# Patient Record
Sex: Female | Born: 1955 | Race: White | Hispanic: No | Marital: Married | Smoking: Never smoker
Health system: Southern US, Community
[De-identification: ages and names within clinical notes are randomized; demographics above are authoritative.]

## PROBLEM LIST (undated history)

## (undated) DIAGNOSIS — Z8742 Personal history of other diseases of the female genital tract: Secondary | ICD-10-CM

## (undated) DIAGNOSIS — R55 Syncope and collapse: Secondary | ICD-10-CM

## (undated) HISTORY — DX: Personal history of other diseases of the female genital tract: Z87.42

## (undated) HISTORY — PX: BREAST BIOPSY: SHX20

## (undated) HISTORY — DX: Syncope and collapse: R55

## (undated) HISTORY — PX: CATARACT EXTRACTION: SUR2

---

## 1997-11-14 ENCOUNTER — Other Ambulatory Visit: Admission: RE | Admit: 1997-11-14 | Discharge: 1997-11-14 | Payer: Self-pay | Admitting: Obstetrics and Gynecology

## 1999-06-24 ENCOUNTER — Other Ambulatory Visit: Admission: RE | Admit: 1999-06-24 | Discharge: 1999-06-24 | Payer: Self-pay | Admitting: Obstetrics and Gynecology

## 2002-03-09 ENCOUNTER — Encounter: Payer: Self-pay | Admitting: Obstetrics and Gynecology

## 2002-03-09 ENCOUNTER — Encounter: Admission: RE | Admit: 2002-03-09 | Discharge: 2002-03-09 | Payer: Self-pay | Admitting: Obstetrics and Gynecology

## 2004-02-14 ENCOUNTER — Ambulatory Visit: Payer: Self-pay | Admitting: Internal Medicine

## 2004-03-08 ENCOUNTER — Other Ambulatory Visit: Admission: RE | Admit: 2004-03-08 | Discharge: 2004-03-08 | Payer: Self-pay | Admitting: Internal Medicine

## 2004-03-08 ENCOUNTER — Ambulatory Visit: Payer: Self-pay | Admitting: Internal Medicine

## 2004-03-14 ENCOUNTER — Encounter: Admission: RE | Admit: 2004-03-14 | Discharge: 2004-03-14 | Payer: Self-pay | Admitting: Internal Medicine

## 2004-03-14 ENCOUNTER — Encounter (INDEPENDENT_AMBULATORY_CARE_PROVIDER_SITE_OTHER): Payer: Self-pay | Admitting: *Deleted

## 2004-11-07 ENCOUNTER — Encounter: Admission: RE | Admit: 2004-11-07 | Discharge: 2004-11-07 | Payer: Self-pay | Admitting: Internal Medicine

## 2005-05-23 ENCOUNTER — Encounter: Admission: RE | Admit: 2005-05-23 | Discharge: 2005-05-23 | Payer: Self-pay | Admitting: Internal Medicine

## 2005-07-01 ENCOUNTER — Encounter: Payer: Self-pay | Admitting: Internal Medicine

## 2005-07-01 ENCOUNTER — Other Ambulatory Visit: Admission: RE | Admit: 2005-07-01 | Discharge: 2005-07-01 | Payer: Self-pay | Admitting: Internal Medicine

## 2005-07-01 ENCOUNTER — Ambulatory Visit: Payer: Self-pay | Admitting: Internal Medicine

## 2006-04-13 ENCOUNTER — Ambulatory Visit: Payer: Self-pay | Admitting: Internal Medicine

## 2006-07-03 ENCOUNTER — Encounter: Admission: RE | Admit: 2006-07-03 | Discharge: 2006-07-03 | Payer: Self-pay | Admitting: Internal Medicine

## 2006-08-04 ENCOUNTER — Ambulatory Visit: Payer: Self-pay | Admitting: Internal Medicine

## 2006-08-10 ENCOUNTER — Encounter: Admission: RE | Admit: 2006-08-10 | Discharge: 2006-08-10 | Payer: Self-pay | Admitting: Internal Medicine

## 2007-03-31 ENCOUNTER — Ambulatory Visit: Payer: Self-pay | Admitting: Internal Medicine

## 2007-03-31 ENCOUNTER — Encounter: Admission: RE | Admit: 2007-03-31 | Discharge: 2007-03-31 | Payer: Self-pay | Admitting: Internal Medicine

## 2007-03-31 DIAGNOSIS — R109 Unspecified abdominal pain: Secondary | ICD-10-CM | POA: Insufficient documentation

## 2007-03-31 LAB — CONVERTED CEMR LAB
Bilirubin Urine: NEGATIVE
Blood in Urine, dipstick: NEGATIVE
Glucose, Urine, Semiquant: NEGATIVE
Specific Gravity, Urine: <1.005
Urobilinogen, UA: NEGATIVE
pH: 5

## 2007-04-01 ENCOUNTER — Telehealth: Payer: Self-pay | Admitting: Internal Medicine

## 2007-04-02 ENCOUNTER — Telehealth (INDEPENDENT_AMBULATORY_CARE_PROVIDER_SITE_OTHER): Payer: Self-pay | Admitting: *Deleted

## 2007-04-07 ENCOUNTER — Telehealth: Payer: Self-pay | Admitting: Internal Medicine

## 2007-04-29 ENCOUNTER — Ambulatory Visit (HOSPITAL_BASED_OUTPATIENT_CLINIC_OR_DEPARTMENT_OTHER): Admission: RE | Admit: 2007-04-29 | Discharge: 2007-04-29 | Payer: Self-pay | Admitting: General Surgery

## 2007-04-29 HISTORY — PX: HERNIA REPAIR: SHX51

## 2007-06-09 ENCOUNTER — Encounter: Payer: Self-pay | Admitting: Internal Medicine

## 2007-08-26 ENCOUNTER — Encounter: Admission: RE | Admit: 2007-08-26 | Discharge: 2007-08-26 | Payer: Self-pay | Admitting: Internal Medicine

## 2008-12-12 ENCOUNTER — Encounter: Admission: RE | Admit: 2008-12-12 | Discharge: 2008-12-12 | Payer: Self-pay | Admitting: Internal Medicine

## 2008-12-15 ENCOUNTER — Encounter: Admission: RE | Admit: 2008-12-15 | Discharge: 2008-12-15 | Payer: Self-pay | Admitting: Internal Medicine

## 2008-12-19 ENCOUNTER — Encounter: Payer: Self-pay | Admitting: Internal Medicine

## 2009-02-08 ENCOUNTER — Telehealth: Payer: Self-pay | Admitting: *Deleted

## 2009-02-08 ENCOUNTER — Ambulatory Visit: Payer: Self-pay | Admitting: Internal Medicine

## 2009-02-08 DIAGNOSIS — R300 Dysuria: Secondary | ICD-10-CM | POA: Insufficient documentation

## 2009-02-08 DIAGNOSIS — N3000 Acute cystitis without hematuria: Secondary | ICD-10-CM | POA: Insufficient documentation

## 2009-02-08 LAB — CONVERTED CEMR LAB: pH: 6

## 2010-04-01 ENCOUNTER — Other Ambulatory Visit: Payer: Self-pay | Admitting: Internal Medicine

## 2010-04-01 DIAGNOSIS — R921 Mammographic calcification found on diagnostic imaging of breast: Secondary | ICD-10-CM

## 2010-04-17 ENCOUNTER — Ambulatory Visit
Admission: RE | Admit: 2010-04-17 | Discharge: 2010-04-17 | Disposition: A | Discharge status: Home / Self Care | Payer: BC Managed Care – PPO | Source: Ambulatory Visit | Attending: Internal Medicine | Admitting: Internal Medicine

## 2010-04-17 DIAGNOSIS — R921 Mammographic calcification found on diagnostic imaging of breast: Secondary | ICD-10-CM

## 2010-07-16 NOTE — Op Note (Signed)
NAME:  Emma Day, AMSLER NO.:  [id number]   MEDICAL RECORD NO.:  [id number]          PATIENT TYPE:  AMB   LOCATION:  DSC                          FACILITY:  [facility]   PHYSICIAN:  Gabrielle Dare. Janee Morn, M.D.DATE OF BIRTH:  17-May-1955   DATE OF PROCEDURE:  04/29/2007  DATE OF DISCHARGE:                               OPERATIVE REPORT   PREOPERATIVE DIAGNOSIS:  Right inguinal hernia.   POSTOPERATIVE DIAGNOSIS:  Right inguinal hernia.   PROCEDURE:  Repair of right inguinal hernia with mesh.   SURGEON:  Gabrielle Dare. Janee Morn, M.D.   ANESTHESIA:  General.   HISTORY OF PRESENT ILLNESS:  Ms. Emma Day is a 55 year old white female who  I evaluated in the office for symptomatic right inguinal hernia.  She  presents today for elective repair.   PROCEDURE IN DETAIL:  She was identified in the preop holding area.  Informed consent was obtained.  Her site was marked.  She received  intravenous antibiotics.  She was brought to the operating room and  general anesthesia with laryngeal mask airway was administered by the  anesthesia staff.  Her abdomen and both groins were prepped and draped  in a sterile fashion.  0.25% Marcaine with epinephrine was injected  along the planned line of incision in the right inguinal region and out  towards the anterior superior iliac spine for postoperative pain relief.  A small right inguinal incision was made.  The subcutaneous tissues were  dissected down.  Bovie cautery was used to get excellent hemostasis.  Dissection continued down through Scarpa's fascia revealing external  oblique.  This was already attenuated due to the hernia.  The superior  leaflet was dissected free off the underlying aponeurosis and the  inferior leaflet was dissected down revealing the shelving edge of the  inguinal ligament.  Her hernia was present right along side the round  ligament.  It was a fluid filled hernia sac.  This was dissected back  and opened, drained, and  securely ligated with 2-0 Vicryl.  The round  ligament was then securely suture ligated proximally and distally and  then was divided.  An extra figure-of-eight ligation was placed  proximally to reinforce the closure of the hernia sac.  Excellent  hemostasis was obtained.  The hernia repair was then completed with a  polypropylene mesh that was cut in a tapered fashion with a rounded end.  This was secured medially to the tissues over the pubic tubercle with 0  Prolene, it was secured in a running fashion with 0 Prolene along the  shelving edge of the inguinal ligament inferiorly, superiorly it was  tacked down in an interrupted fashion to the tissues over the pubic  tubercle with 0 Prolene, and then it was tacked down to the  transversalis and secured in place.  It was tucked down under the  external oblique laterally and it was tacked down laterally to the  underlying musculature, as well, completing the repair.  Meticulous  hemostasis was ensured.  The area was copiously irrigated.  Additional  local anesthetic was injected, the mesh  laid nice and flat.  The  external oblique was then closed over the top of the mesh with a running  2-0 Vicryl suture, the subcutaneous tissues were irrigated, hemostasis  was ensured, Scarpa's fascia was approximated with interrupted 3-0  Vicryl sutures, and the skin was closed with a running 4-0 Monocryl  subcuticular  stitch.  Sponge, needle, and instrument counts were correct.  Benzoin,  Steri-Strips, and sterile dressings were applied.  The patient tolerated  the procedure well without apparent complications.  She was taken to the  recovery room in stable condition.      Gabrielle Dare Janee Morn, M.D.  Electronically Signed     BET/MEDQ  D:  04/29/2007  T:  04/29/2007  Job:  [id number]   cc:   Neta Mends. Fabian Sharp, MD  Juluis Mire, M.D.

## 2010-11-22 LAB — POCT HEMOGLOBIN-HEMACUE: Hemoglobin: 13.5

## 2010-12-06 ENCOUNTER — Encounter: Payer: Self-pay | Admitting: Internal Medicine

## 2010-12-11 ENCOUNTER — Encounter: Payer: Self-pay | Admitting: Internal Medicine

## 2010-12-12 ENCOUNTER — Ambulatory Visit (INDEPENDENT_AMBULATORY_CARE_PROVIDER_SITE_OTHER): Payer: BC Managed Care – PPO | Admitting: Internal Medicine

## 2010-12-12 ENCOUNTER — Encounter: Payer: Self-pay | Admitting: Internal Medicine

## 2010-12-12 ENCOUNTER — Other Ambulatory Visit (HOSPITAL_COMMUNITY)
Admission: RE | Admit: 2010-12-12 | Discharge: 2010-12-12 | Disposition: A | Discharge status: Home / Self Care | Payer: BC Managed Care – PPO | Source: Ambulatory Visit | Attending: Internal Medicine | Admitting: Internal Medicine

## 2010-12-12 DIAGNOSIS — R1031 Right lower quadrant pain: Secondary | ICD-10-CM | POA: Insufficient documentation

## 2010-12-12 DIAGNOSIS — Z23 Encounter for immunization: Secondary | ICD-10-CM

## 2010-12-12 DIAGNOSIS — Z124 Encounter for screening for malignant neoplasm of cervix: Secondary | ICD-10-CM

## 2010-12-12 DIAGNOSIS — Z01419 Encounter for gynecological examination (general) (routine) without abnormal findings: Secondary | ICD-10-CM | POA: Insufficient documentation

## 2010-12-12 NOTE — Patient Instructions (Signed)
Will arrange surgical consult. Call if worse in meantime. Keep  Wellness appt Will notify you when pap results are available.

## 2010-12-12 NOTE — Progress Notes (Signed)
  Subjective:    Patient ID: Emma Day, female    DOB: [birth year], 55 y.o.   MRN: [id number]  HPI Comes in for new problem .   Last ov was    12 /10 .    Has had hx of recurrent ovarian cyst  In the past  Now having  Dull pain on right side where had hernia surgery.   Dull  And sometimes goes down leg.  Worse with  Nothing.  Better with advil.  About 5/10  Comes and goes.  Still having periods spotting for 4-5 days and then 2 days of bleeding. Regularly.  No significant dysmenorrhea.  LMP about sept 15th.  Last pap a while ago.  Has been well since last visit  Due for check up .  appt in 2013  Review of Systems No fever  Some increase nocturia. Hematuria or other. No weight loss fever flank pain hematuria No colonoscopy yet.  Otherwise well.  Past history family history social history reviewed in the electronic medical record.     Objective:   Physical Exam WDWN in nad  Chest:  Clear to A&P without wheezes rales or rhonchi CV:  S1-S2 no gallops or murmurs peripheral perfusion is normal Mild kyphosis Abdomen:  Sof,t normal bowel sounds without hepatosplenomegaly, no guarding rebound  no CVA tenderness..   Well healed right ing hernia scar  Just superior has mass thickening  The length of the scar and this is midly tender . NO bulging or redness. Pelvic: NL ext GU, labia clear without lesions or rash . Vagina no lesions .Cervix: clear  UTERUS: Neg CMT Adnexa:  clear no masses . Some blood at os ( early period)  PAP done      Assessment & Plan:  Abd pelvic pain Seems assoc to area of hernia repair.  Pelvic basically normal although if  persistent or progressive consider pelvic transvag US.  Will begin with surgical opinion.

## 2010-12-19 ENCOUNTER — Encounter: Payer: Self-pay | Admitting: *Deleted

## 2010-12-19 NOTE — Progress Notes (Signed)
Quick Note:  Tell patient PAP is normal. ______ 

## 2011-01-01 ENCOUNTER — Encounter (INDEPENDENT_AMBULATORY_CARE_PROVIDER_SITE_OTHER): Payer: Self-pay | Admitting: General Surgery

## 2011-01-01 ENCOUNTER — Ambulatory Visit (INDEPENDENT_AMBULATORY_CARE_PROVIDER_SITE_OTHER): Payer: BC Managed Care – PPO | Admitting: General Surgery

## 2011-01-01 VITALS — BP 118/80 | HR 60 | Temp 97.7°F | Resp 18 | Ht 66.0 in | Wt 143.5 lb

## 2011-01-01 DIAGNOSIS — R1031 Right lower quadrant pain: Secondary | ICD-10-CM

## 2011-01-01 MED ORDER — PREGABALIN 75 MG PO CAPS: 75.0000 mg | ORAL_CAPSULE | Freq: Two times a day (BID) | ORAL | Status: DC

## 2011-01-01 NOTE — Progress Notes (Signed)
Subjective:     Patient ID: Emma Day, female   DOB: May 08, 1955, 55 y.o.   MRN: [id number]  HPI Patient status post right inguinal hernia repair with mesh and February of 2009. Over the past 2 months, she has developed pain extending from lateral to her incision down along her medial thigh and the right side. She's not had any swelling in the area. She has not had any overlying skin changes visible.  Review of Systems     Objective:   Physical Exam  Cardiovascular: Normal rate and regular rhythm.   Pulmonary/Chest: Effort normal and breath sounds normal. No respiratory distress. She has no wheezes.  Abdominal: Soft. She exhibits no distension and no mass. There is no tenderness. There is no rebound.  Inguinal exam reveals well-healed incision. Hernia repair is intact. No masses are felt. No skin changes are noted. The area of discomfort was indicated by the patient     Assessment:     Right inguinal pain status post right inguinal hernia repair with mesh is likely due to nerve irritation     Plan:     I recommend  a short course of Lyrica. I will see her back in 4 weeks. If this does not resolve she may need an injection.

## 2011-01-01 NOTE — Patient Instructions (Signed)
Do not drive until you see how the medication affects you

## 2011-01-29 ENCOUNTER — Encounter (INDEPENDENT_AMBULATORY_CARE_PROVIDER_SITE_OTHER): Payer: BC Managed Care – PPO | Admitting: General Surgery

## 2011-05-26 ENCOUNTER — Other Ambulatory Visit (INDEPENDENT_AMBULATORY_CARE_PROVIDER_SITE_OTHER): Payer: BC Managed Care – PPO

## 2011-05-26 DIAGNOSIS — Z0000 Encounter for general adult medical examination without abnormal findings: Secondary | ICD-10-CM

## 2011-05-26 LAB — BASIC METABOLIC PANEL
BUN: 14 mg/dL (ref 6–23)
CO2: 28 mEq/L (ref 19–32)
Chloride: 105 mEq/L (ref 96–112)
Creatinine, Ser: 0.9 mg/dL (ref 0.4–1.2)
Glucose, Bld: 98 mg/dL (ref 70–99)
Potassium: 5.1 mEq/L (ref 3.5–5.1)

## 2011-05-26 LAB — CBC WITH DIFFERENTIAL/PLATELET
Basophils Absolute: 0 10*3/uL (ref 0.0–0.1)
Eosinophils Absolute: 0.3 10*3/uL (ref 0.0–0.7)
Lymphocytes Relative: 30.7 % (ref 12.0–46.0)
Lymphs Abs: 1.9 10*3/uL (ref 0.7–4.0)
MCV: 91.2 fl (ref 78.0–100.0)
Platelets: 249 10*3/uL (ref 150.0–400.0)
RBC: 4.84 Mil/uL (ref 3.87–5.11)
RDW: 13.1 % (ref 11.5–14.6)

## 2011-05-26 LAB — POCT URINALYSIS DIPSTICK
Glucose, UA: NEGATIVE
Nitrite, UA: NEGATIVE
Urobilinogen, UA: 0.2

## 2011-05-26 LAB — HEPATIC FUNCTION PANEL
ALT: 17 U/L (ref 0–35)
AST: 17 U/L (ref 0–37)
Alkaline Phosphatase: 56 U/L (ref 39–117)
Bilirubin, Direct: 0 mg/dL (ref 0.0–0.3)
Total Bilirubin: 0.4 mg/dL (ref 0.3–1.2)
Total Protein: 7.2 g/dL (ref 6.0–8.3)

## 2011-05-26 LAB — LIPID PANEL
Total CHOL/HDL Ratio: 3
Triglycerides: 128 mg/dL (ref 0.0–149.0)

## 2011-06-03 ENCOUNTER — Encounter: Payer: Self-pay | Admitting: Internal Medicine

## 2011-06-03 ENCOUNTER — Ambulatory Visit (INDEPENDENT_AMBULATORY_CARE_PROVIDER_SITE_OTHER): Payer: BC Managed Care – PPO | Admitting: Internal Medicine

## 2011-06-03 VITALS — BP 102/76 | HR 80 | Temp 98.2°F | Ht 65.5 in | Wt 143.0 lb

## 2011-06-03 DIAGNOSIS — R1031 Right lower quadrant pain: Secondary | ICD-10-CM

## 2011-06-03 DIAGNOSIS — Z1211 Encounter for screening for malignant neoplasm of colon: Secondary | ICD-10-CM

## 2011-06-03 NOTE — Progress Notes (Signed)
Subjective:    Patient ID: Emma Day, female    DOB: February 15, 1956, 56 y.o.   MRN: [id number]  HPI  Patient comes in today for preventive visit and follow-up of medical issues. Update  history since  last visit: No major changes ; ,injury surgery or hospitalizations. Saw surgeon who said pain was abd wall scar and given lyrica but didn't take cause not that bad . Waxes and wanes  And tylenol as needed. Never had colonscopy. Otherwise feeling pretty healthy wears readers. Does reg exercise .    Review of Systems ROS:  GEN/ HEENT: No fever, significant weight changes sweats headaches vision problems hearing changes, CV/ PULM; No chest pain shortness of breath cough, syncope,edema  change in exercise tolerance. GI /GU: No adominal pain, vomiting, change in bowel habits. No blood in the stool. No significant GU symptoms. SKIN/HEME: ,no acute skin rashes suspicious lesions or bleeding. No lymphadenopathy, nodules, masses.  NEURO/ PSYCH:  No neurologic signs such as weakness numbness. No depression anxiety. IMM/ Allergy: No unusual infections.  Allergy .   REST of 12 system review negative except as per HPI .Marland Kitchen   No meds  Past history family history social history reviewed in the electronic medical record.       Objective:   Physical Exam BP 102/76  Pulse 80  Temp(Src) 98.2 F (36.8 C) (Oral)  Ht 5' 5.5" (1.664 m)  Wt 143 lb (64.864 kg)  BMI 23.43 kg/m2  SpO2 97%  LMP 05/20/2011 Physical Exam: Vital signs reviewed WUJ:WJXB is a well-developed well-nourished alert cooperative  white female who appears her stated age in no acute distress.  HEENT: normocephalic atraumatic , Eyes: PERRL EOM's full, conjunctiva clear, Nares: paten,t no deformity discharge or tenderness., Ears: no deformity EAC's clear TMs with normal landmarks. Mouth: clear OP, no lesions, edema.  Moist mucous membranes. Dentition in adequate repair. NECK: supple without masses, thyromegaly or bruits. CHEST/PULM:   Clear to auscultation and percussion breath sounds equal no wheeze , rales or rhonchi. No chest wall deformities or tenderness. Breast: normal by inspection . No dimpling, discharge, masses, tenderness or discharge . CV: PMI is nondisplaced, S1 S2 no gallops, murmurs, rubs. Peripheral pulses are full without delay.No JVD .  ABDOMEN: Bowel sounds normal nontender  No guard or rebound, no hepato splenomegal no CVA tenderness.  No hernia.  Small area near scar right lower groin area minimally tender no bulge.  Extremtities:  No clubbing cyanosis or edema, no acute joint swelling or redness no focal atrophy NEURO:  Oriented x3, cranial nerves 3-12 appear to be intact, no obvious focal weakness,gait within normal limits no abnormal reflexes or asymmetrical SKIN: No acute rashes normal turgor, color, no bruising or petechiae. PSYCH: Oriented, good eye contact, no obvious depression anxiety, cognition and judgment appear normal. LN: no cervical axillary inguinal adenopathy    Lab Results  Component Value Date   WBC 6.3 05/26/2011   HGB 14.5 05/26/2011   HCT 44.1 05/26/2011   PLT 249.0 05/26/2011   GLUCOSE 98 05/26/2011   CHOL 201* 05/26/2011   TRIG 128.0 05/26/2011   HDL 62.30 05/26/2011   LDLDIRECT 123.5 05/26/2011   ALT 17 05/26/2011   AST 17 05/26/2011   NA 143 05/26/2011   K 5.1 05/26/2011   CL 105 05/26/2011   CREATININE 0.9 05/26/2011   BUN 14 05/26/2011   CO2 28 05/26/2011   TSH 2.52 05/26/2011        Assessment & Plan:  Preventive Health Care  Counseled regarding healthy nutrition, exercise, sleep, injury prevention, calcium vit d and healthy weight .   Ref for colonoscopy.   Recheck in a year or as needed   rlq pain off and on   Poss scar tissue pain  Using pain meds prn tylenol. meds as needed.

## 2011-06-03 NOTE — Patient Instructions (Addendum)
Continue lifestyle intervention healthy eating and exercise .  Check up in about in a year  Flu shot yearly  Advised . Get colonoscopy.  Mammorgram every year . PAP every 3 years

## 2011-08-07 ENCOUNTER — Encounter: Payer: Self-pay | Admitting: Gastroenterology

## 2011-09-29 ENCOUNTER — Encounter: Payer: Self-pay | Admitting: Internal Medicine

## 2011-09-29 ENCOUNTER — Ambulatory Visit (INDEPENDENT_AMBULATORY_CARE_PROVIDER_SITE_OTHER): Payer: BC Managed Care – PPO | Admitting: Internal Medicine

## 2011-09-29 VITALS — BP 130/82 | HR 101 | Temp 99.0°F | Wt 141.0 lb

## 2011-09-29 DIAGNOSIS — N951 Menopausal and female climacteric states: Secondary | ICD-10-CM

## 2011-09-29 DIAGNOSIS — R0789 Other chest pain: Secondary | ICD-10-CM

## 2011-09-29 DIAGNOSIS — R071 Chest pain on breathing: Secondary | ICD-10-CM

## 2011-09-29 DIAGNOSIS — N920 Excessive and frequent menstruation with regular cycle: Secondary | ICD-10-CM

## 2011-09-29 DIAGNOSIS — J069 Acute upper respiratory infection, unspecified: Secondary | ICD-10-CM

## 2011-09-29 NOTE — Progress Notes (Signed)
  Subjective:    Patient ID: Emma Day, female    DOB: April 02, 1955, 56 y.o.   MRN: [id number]  HPI Patient comes in today for SDA for  new problem evaluation. Onset 5-6 days ago with a sore throat and over the last 4 days or so she developed a cough settled in her lungs. She is coughing hard with an occasional phlegm and one speck of blood this morning. She doesn't have hemoptysis. Denies shortness of breath or wheezing. Think she may have pulled something in her left anterior chest hurts to take a deep breath but no shortness of breath. No history of asthma lung disease tobacco. She tried Tylenol Cold and cough.  In addition as a question about menopause she had been having periods spaced out every 2-3 months and over the last month or 2 she has had a period every 2 weeks the last about 4 days. No spotting in between with significant pain asks about normalcy of this. Review of Systems Negative shortness of breath leg swelling pleurisy no fever and chills Past history family history social history reviewed in the electronic medical record. Outpatient Encounter Prescriptions as of 09/29/2011  Medication Sig Dispense Refill  . folic acid (FOLVITE) 1 MG tablet Take 1 mg by mouth daily.        . MULTIPLE VITAMIN PO Take by mouth.            Objective:   Physical Exam  BP 130/82  Pulse 101  Temp 99 F (37.2 C) (Oral)  Wt 141 lb (63.957 kg)  SpO2 98%  LMP 09/26/2011  HEENT: Normocephalic ;atraumatic , Eyes;  PERRL, EOMs  Full, lids and conjunctiva clear,,Ears: no deformities, canals nl, TM landmarks normal, Nose: no deformity or discharge  Mouth : OP clear without lesion or edema . Chest:  Clear to A&P without wheezes rales or rhonchi tender left anterior cc junction area  CV:  S1-S2 no gallops or murmurs peripheral perfusion is normal Abdomen:  Sof,t normal bowel sounds without hepatosplenomegaly, no guarding rebound or masses no CVAtenderness    Assessment & Plan:  Cough most likely  viral respiratory infection Left anterior chest discomfort most likely chest wall pain Expectant management symptomatic treatment declined codeine cough medicine at this time. Reviewed alarm symptoms and strategies. Should be a lot better next week. Contact us for alarm features Frequent periods perimenopausal these periods appear to be cyclic and lasting only 4-5 days although are more frequent than she has had. At this time it will is safe to wait and if he is continuing over the next couple months we will get gynecology to consult. If she has daily spotting and bleeding that would be another reason for referral.

## 2011-09-29 NOTE — Patient Instructions (Signed)
Comfort mesures and fu if  persistent or progressive or feer or alarm features   As discussed. Also if  Prolonged periods or spotting over the next few months   consider gyne consult.

## 2011-10-01 ENCOUNTER — Other Ambulatory Visit: Payer: Self-pay | Admitting: Internal Medicine

## 2011-10-01 DIAGNOSIS — Z1231 Encounter for screening mammogram for malignant neoplasm of breast: Secondary | ICD-10-CM

## 2011-10-13 ENCOUNTER — Ambulatory Visit
Admission: RE | Admit: 2011-10-13 | Discharge: 2011-10-13 | Disposition: A | Discharge status: Home / Self Care | Payer: BC Managed Care – PPO | Source: Ambulatory Visit | Attending: Internal Medicine | Admitting: Internal Medicine

## 2011-10-13 DIAGNOSIS — Z1231 Encounter for screening mammogram for malignant neoplasm of breast: Secondary | ICD-10-CM

## 2012-01-27 ENCOUNTER — Observation Stay (HOSPITAL_COMMUNITY)
Admission: EM | Admit: 2012-01-27 | Discharge: 2012-01-27 | Disposition: A | Discharge status: Home / Self Care | Payer: BC Managed Care – PPO | Attending: Internal Medicine | Admitting: Internal Medicine

## 2012-01-27 ENCOUNTER — Encounter (HOSPITAL_COMMUNITY): Payer: Self-pay | Admitting: Emergency Medicine

## 2012-01-27 ENCOUNTER — Emergency Department (HOSPITAL_COMMUNITY): Payer: BC Managed Care – PPO

## 2012-01-27 DIAGNOSIS — R0989 Other specified symptoms and signs involving the circulatory and respiratory systems: Secondary | ICD-10-CM

## 2012-01-27 DIAGNOSIS — Z23 Encounter for immunization: Secondary | ICD-10-CM | POA: Insufficient documentation

## 2012-01-27 DIAGNOSIS — R32 Unspecified urinary incontinence: Secondary | ICD-10-CM | POA: Insufficient documentation

## 2012-01-27 DIAGNOSIS — R4182 Altered mental status, unspecified: Secondary | ICD-10-CM | POA: Insufficient documentation

## 2012-01-27 DIAGNOSIS — H539 Unspecified visual disturbance: Secondary | ICD-10-CM | POA: Insufficient documentation

## 2012-01-27 DIAGNOSIS — R55 Syncope and collapse: Principal | ICD-10-CM | POA: Insufficient documentation

## 2012-01-27 LAB — BASIC METABOLIC PANEL
Calcium: 8.6 mg/dL (ref 8.4–10.5)
Chloride: 103 mEq/L (ref 96–112)
Creatinine, Ser: 0.78 mg/dL (ref 0.50–1.10)
Creatinine, Ser: 0.74 mg/dL (ref 0.50–1.10)
GFR calc Af Amer: >90 mL/min (ref >90)
GFR calc Af Amer: >90 mL/min (ref >90)
GFR calc non Af Amer: >90 mL/min (ref >90)
GFR calc non Af Amer: >90 mL/min (ref >90)
Sodium: 139 mEq/L (ref 135–145)

## 2012-01-27 LAB — CBC WITH DIFFERENTIAL/PLATELET
Basophils Absolute: 0 10*3/uL (ref 0.0–0.1)
Basophils Relative: 0 % (ref 0–1)
HCT: 38.7 % (ref 36.0–46.0)
Lymphocytes Relative: 14 % (ref 12–46)
MCHC: 33.6 g/dL (ref 30.0–36.0)
Monocytes Absolute: 0.4 10*3/uL (ref 0.1–1.0)
Neutro Abs: 6.6 10*3/uL (ref 1.7–7.7)
Neutrophils Relative %: 80 % — ABNORMAL HIGH (ref 43–77)
Platelets: 258 10*3/uL (ref 150–400)
RDW: 12.6 % (ref 11.5–15.5)
WBC: 8.3 10*3/uL (ref 4.0–10.5)

## 2012-01-27 LAB — URINALYSIS, ROUTINE W REFLEX MICROSCOPIC
Leukocytes, UA: NEGATIVE
Nitrite: NEGATIVE
Specific Gravity, Urine: 1.007 (ref 1.005–1.030)
Urobilinogen, UA: 0.2 mg/dL (ref 0.0–1.0)
pH: 5 (ref 5.0–8.0)

## 2012-01-27 LAB — CBC
Platelets: 254 10*3/uL (ref 150–400)
RBC: 4.16 MIL/uL (ref 3.87–5.11)
RDW: 12.8 % (ref 11.5–15.5)
WBC: 8.4 10*3/uL (ref 4.0–10.5)

## 2012-01-27 LAB — D-DIMER, QUANTITATIVE: D-Dimer, Quant: <0.27 ug/mL-FEU (ref 0.00–0.48)

## 2012-01-27 LAB — TROPONIN I: Troponin I: <0.3 ng/mL (ref <0.30)

## 2012-01-27 LAB — MAGNESIUM: Magnesium: 1.9 mg/dL (ref 1.5–2.5)

## 2012-01-27 MED ORDER — INFLUENZA VIRUS VACC SPLIT PF IM SUSP
0.5000 mL | Freq: Once | INTRAMUSCULAR | Status: AC
  Administered 2012-01-27: 0.5 mL via INTRAMUSCULAR
  Filled 2012-01-27: qty 0.5

## 2012-01-27 MED ORDER — SODIUM CHLORIDE 0.9 % IV SOLN: INTRAVENOUS | Status: AC

## 2012-01-27 MED ORDER — SODIUM CHLORIDE 0.9 % IV SOLN
INTRAVENOUS | Status: DC
  Administered 2012-01-27: 01:00:00 via INTRAVENOUS

## 2012-01-27 MED ORDER — ACETAMINOPHEN 650 MG RE SUPP: 650.0000 mg | Freq: Four times a day (QID) | RECTAL | Status: DC | PRN

## 2012-01-27 MED ORDER — SODIUM CHLORIDE 0.9 % IJ SOLN
3.0000 mL | Freq: Two times a day (BID) | INTRAMUSCULAR | Status: DC
  Administered 2012-01-27: 3 mL via INTRAVENOUS

## 2012-01-27 MED ORDER — ACETAMINOPHEN 325 MG PO TABS: 650.0000 mg | ORAL_TABLET | Freq: Four times a day (QID) | ORAL | Status: DC | PRN

## 2012-01-27 MED ORDER — SODIUM CHLORIDE 0.9 % IV SOLN: INTRAVENOUS | Status: DC

## 2012-01-27 MED ORDER — ONDANSETRON HCL 4 MG/2ML IJ SOLN: 4.0000 mg | Freq: Four times a day (QID) | INTRAMUSCULAR | Status: DC | PRN

## 2012-01-27 MED ORDER — ONDANSETRON HCL 4 MG PO TABS: 4.0000 mg | ORAL_TABLET | Freq: Four times a day (QID) | ORAL | Status: DC | PRN

## 2012-01-27 MED ORDER — FOLIC ACID 1 MG PO TABS
1.0000 mg | ORAL_TABLET | Freq: Every day | ORAL | Status: DC
  Administered 2012-01-27: 1 mg via ORAL
  Filled 2012-01-27: qty 1

## 2012-01-27 NOTE — Progress Notes (Signed)
UR Completed Ki Luckman Graves-Bigelow, RN,BSN [phone]  

## 2012-01-27 NOTE — H&P (Signed)
Emma Day is an 56 y.o. female.   Patient was seen and examined on January 27, 2012. PCP - Dr. Darrell Jewel. Chief Complaint: Loss of consciousness. HPI: 56 year old female with no significant past medical history was brought to the ER the patient had a brief spell of loss of consciousness. Last night around 11:20 PM when patient was sending off her guestsat her house she suddenly felt dizzy and difficulty to see. At this point patient was made to lie down. Later on patient lost consciousness for around 1-2 minutes as witnessed by patient's husband. During which patient's pulse measured by family was very low. Patient also had incontinence of urine. Denies any tongue bite or any seizure like activity. Patient regained consciousness and was  Confused for 2-3 minutes.  Patient has not had similar symptoms previously. Denies any headache palpitations chest pain shortness of breath or any focal deficit prior after the incident. In the ER patient had EKG which are signed normal sinus rhythm and CT head was negative for any acute. Patient has been admitted for further observation.  Past Medical History  Diagnosis Date  . History of ovarian cyst     Past Surgical History  Procedure Date  . Hernia repair 04/29/2007    inguinal rt     Family History  Problem Relation Age of Onset  . Coronary artery disease    . Parkinsonism    . Other      low hdl   Social History:  reports that she has never smoked. She does not have any smokeless tobacco history on file. She reports that she drinks about 3.5 ounces of alcohol per week. She reports that she does not use illicit drugs.  Allergies: No Known Allergies   (Not in a hospital admission)  Results for orders placed during the hospital encounter of 01/27/12 (from the past 48 hour(s))  BASIC METABOLIC PANEL     Status: Abnormal   Collection Time   01/27/12  1:31 AM      Component Value Range Comment   Sodium 136  135 - 145 mEq/L    Potassium 3.9   3.5 - 5.1 mEq/L    Chloride 103  96 - 112 mEq/L    CO2 21  19 - 32 mEq/L    Glucose, Bld 101 (*) 70 - 99 mg/dL    BUN 13  6 - 23 mg/dL    Creatinine, Ser 9.60  0.50 - 1.10 mg/dL    Calcium 9.0  8.4 - 45.4 mg/dL    GFR calc non Af Amer >90  >90 mL/min    GFR calc Af Amer >90  >90 mL/min   CBC WITH DIFFERENTIAL     Status: Abnormal   Collection Time   01/27/12  1:31 AM      Component Value Range Comment   WBC 8.3  4.0 - 10.5 K/uL    RBC 4.40  3.87 - 5.11 MIL/uL    Hemoglobin 13.0  12.0 - 15.0 g/dL    HCT 09.8  11.9 - 14.7 %    MCV 88.0  78.0 - 100.0 fL    MCH 29.5  26.0 - 34.0 pg    MCHC 33.6  30.0 - 36.0 g/dL    RDW 82.9  56.2 - 13.0 %    Platelets 258  150 - 400 K/uL    Neutrophils Relative 80 (*) 43 - 77 %    Neutro Abs 6.6  1.7 - 7.7 K/uL    Lymphocytes  Relative 14  12 - 46 %    Lymphs Abs 1.2  0.7 - 4.0 K/uL    Monocytes Relative 5  3 - 12 %    Monocytes Absolute 0.4  0.1 - 1.0 K/uL    Eosinophils Relative 1  0 - 5 %    Eosinophils Absolute 0.1  0.0 - 0.7 K/uL    Basophils Relative 0  0 - 1 %    Basophils Absolute 0.0  0.0 - 0.1 K/uL   MAGNESIUM     Status: Normal   Collection Time   01/27/12  1:31 AM      Component Value Range Comment   Magnesium 1.9  1.5 - 2.5 mg/dL   D-DIMER, QUANTITATIVE     Status: Normal   Collection Time   01/27/12  1:32 AM      Component Value Range Comment   D-Dimer, Quant <0.27  0.00 - 0.48 ug/mL-FEU   TROPONIN I     Status: Normal   Collection Time   01/27/12  1:34 AM      Component Value Range Comment   Troponin I <0.30  <0.30 ng/mL   URINALYSIS, ROUTINE W REFLEX MICROSCOPIC     Status: Normal   Collection Time   01/27/12  2:30 AM      Component Value Range Comment   Color, Urine YELLOW  YELLOW    APPearance CLEAR  CLEAR    Specific Gravity, Urine 1.007  1.005 - 1.030    pH 5.0  5.0 - 8.0    Glucose, UA NEGATIVE  NEGATIVE mg/dL    Hgb urine dipstick NEGATIVE  NEGATIVE    Bilirubin Urine NEGATIVE  NEGATIVE    Ketones, ur  NEGATIVE  NEGATIVE mg/dL    Protein, ur NEGATIVE  NEGATIVE mg/dL    Urobilinogen, UA 0.2  0.0 - 1.0 mg/dL    Nitrite NEGATIVE  NEGATIVE    Leukocytes, UA NEGATIVE  NEGATIVE MICROSCOPIC NOT DONE ON URINES WITH NEGATIVE PROTEIN, BLOOD, LEUKOCYTES, NITRITE, OR GLUCOSE <1000 mg/dL.   Ct Head Wo Contrast  01/27/2012  *RADIOLOGY REPORT*  Clinical Data: Syncope; urinary incontinence.  Lightheaded.  CT HEAD WITHOUT CONTRAST  Technique:  Contiguous axial images were obtained from the base of the skull through the vertex without contrast.  Comparison: None.  Findings: There is no evidence of acute infarction, mass lesion, or intra- or extra-axial hemorrhage on CT.  The posterior fossa, including the cerebellum, brainstem and fourth ventricle, is within normal limits.  The third and lateral ventricles, and basal ganglia are unremarkable in appearance.  The cerebral hemispheres are symmetric in appearance, with normal gray- white differentiation.  No mass effect or midline shift is seen.  There is no evidence of fracture; visualized osseous structures are unremarkable in appearance.  The visualized portions of the orbits are within normal limits.  The paranasal sinuses and mastoid air cells are well-aerated.  No significant soft tissue abnormalities are seen.  IMPRESSION: Unremarkable noncontrast CT of the head.   Original Report Authenticated By: Tonia Ghent, M.D.     Review of Systems  Constitutional: Negative.   HENT: Negative.   Eyes: Negative.   Respiratory: Negative.   Cardiovascular: Negative.   Gastrointestinal: Negative.   Genitourinary: Negative.   Musculoskeletal: Negative.   Skin: Negative.   Neurological: Positive for loss of consciousness.  Endo/Heme/Allergies: Negative.   Psychiatric/Behavioral: Negative.     Blood pressure 119/71, pulse 99, temperature 98.2 F (36.8 C), temperature source Oral, resp. rate 14,  last menstrual period 01/20/2012, SpO2 99.00%. Physical Exam    Constitutional: She is oriented to person, place, and time. She appears well-developed and well-nourished. No distress.  HENT:  Head: Normocephalic and atraumatic.  Right Ear: External ear normal.  Left Ear: External ear normal.  Nose: Nose normal.  Mouth/Throat: Oropharynx is clear and moist. No oropharyngeal exudate.  Eyes: Conjunctivae normal are normal. Pupils are equal, round, and reactive to light. Right eye exhibits no discharge. Left eye exhibits no discharge. No scleral icterus.  Neck: Normal range of motion.       ?Bruit on the right side.  Cardiovascular: Normal rate and regular rhythm.   Respiratory: Effort normal and breath sounds normal. No respiratory distress. She has no wheezes. She has no rales.  GI: Soft. Bowel sounds are normal. She exhibits no distension. There is no tenderness. There is no rebound.  Musculoskeletal: She exhibits no edema and no tenderness.  Neurological: She is alert and oriented to person, place, and time.       Moves all extremities.  Skin: Skin is warm and dry. She is not diaphoretic.     Assessment/Plan #1. Syncope - cause is not clear. Continue to monitor in telemetry to check for any arrhythmias. Since there was a concern for possible right carotid bruit, carotid Dopplers has been ordered. Check 2-D echo. Check orthostatics in a.m. For now gently hydrate.  CODE STATUS - full code.  Brandye Inthavong N. 01/27/2012, 3:33 AM

## 2012-01-27 NOTE — ED Notes (Signed)
Pt ambulated to bathroom independently with no problem; pt denies dizziness; just states feels weak

## 2012-01-27 NOTE — Discharge Summary (Signed)
Physician Discharge Summary  Emma Day:[id number] DOB: Jul 27, 1955 DOA: 01/27/2012  PCP: Lorretta Harp, MD  Admit date: 01/27/2012 Discharge date: 01/27/2012  Recommendations for Outpatient Follow-up:  1. PCP in 1 week  Discharge Diagnoses:  Principal Problem:  *Syncope   Discharge Condition: stable, improved  Diet recommendation: healthy heart  Wt Readings from Last 3 Encounters:  01/27/12 62.324 kg (137 lb 6.4 oz)  09/29/11 63.957 kg (141 lb)  06/03/11 64.864 kg (143 lb)    History of present illness:   56 year old female with no significant past medical history was brought to the ER the patient had a brief spell of loss of consciousness. Last night around 11:20 PM when patient was sending off her guests at her house she suddenly felt dizzy and difficulty to see.  At this point patient was made to lie down. Later on patient lost consciousness for around 1-2 minutes as witnessed by patient's husband. During which patient's pulse measured by family was very low. Patient also had incontinence of urine. Denies any tongue bite or any seizure like activity. Patient regained consciousness and was confused for 2-3 minutes. Patient has not had similar symptoms previously. Denies any headache palpitations chest pain shortness of breath or any focal deficit prior after the incident. In the ER patient had EKG which are signed normal sinus rhythm and CT head was negative for any acute. Patient has been admitted for further observation.   Hospital Course:   #1. Syncope - Troponin and d-dimer were negative.  ECG was normal sinus and telemetry showed no arrhythmias.  Her ECHO demonstrated normal structure and function.  Her carotid ultrasound demonstrated no significant carotid disease and antegrade vertebral flow.    Procedures:  ECHO  Carotid duplex  Consultations:  none  Discharge Exam: Filed Vitals:   01/27/12 0521  BP: 109/71  Pulse:   Temp:   Resp:    Filed  Vitals:   01/27/12 0515 01/27/12 0517 01/27/12 0519 01/27/12 0521  BP: 110/68 118/76 128/70 109/71  Pulse:      Temp: 98 F (36.7 C)     TempSrc:      Resp: 16     Height: 5\' 6"  (1.676 m)     Weight: 62.324 kg (137 lb 6.4 oz)     SpO2: 100%       General: Caucasian female, no acute distress HEENT:  MMM Cardiovascular: RRR, no mrg, 2+ pulses Respiratory: CTAB Neuro:  III-XII grossly intact, strength 5/5, sensation intact to light touch  Discharge Instructions      Discharge Orders    Future Orders Please Complete By Expires   Diet - low sodium heart healthy      Increase activity slowly      Discharge instructions      Comments:   You had a fainting spell from an unknown cause.  You were tested for heart attack, heart arrhythmia, electrolyte imbalance, anemia, urinary tract infection, pulmonary embolism, and narrowing of the blood vessels to the brain.  Your head CT was normal.  Please drink at least 1.5 to 2L of fluids every day, get regular sleep, exercise and eat a healthy diet.  If you feel lightheaded again, please put your feet above your head and stay in that position until the episode passes.  You may feel tired for several days after a long faint, have lactose intolerance, and dark urine.  Stay hydrated, avoid milk products if needed, and avoid ibuprofen and aleve for the next several days.  If your urine turns dark, let your primary care doctor know.   Call MD for:  persistant nausea and vomiting      Call MD for:  severe uncontrolled pain      Call MD for:  difficulty breathing, headache or visual disturbances      Call MD for:  persistant dizziness or light-headedness      Call MD for:  hives      Call MD for:  extreme fatigue      Call MD for:  temperature >100.4          Medication List     As of 01/27/2012  3:46 PM    TAKE these medications         folic acid 1 MG tablet   Commonly known as: FOLVITE   Take 1 mg by mouth daily.      MULTIPLE VITAMIN PO     Take 1 tablet by mouth daily.        Follow-up Information    Follow up with Lorretta Harp, MD. In 1 week.   Contact information:   [address] [place] [address] [phone]           The results of significant diagnostics from this hospitalization (including imaging, microbiology, ancillary and laboratory) are listed below for reference.    Significant Diagnostic Studies: Ct Head Wo Contrast  01/27/2012  *RADIOLOGY REPORT*  Clinical Data: Syncope; urinary incontinence.  Lightheaded.  CT HEAD WITHOUT CONTRAST  Technique:  Contiguous axial images were obtained from the base of the skull through the vertex without contrast.  Comparison: None.  Findings: There is no evidence of acute infarction, mass lesion, or intra- or extra-axial hemorrhage on CT.  The posterior fossa, including the cerebellum, brainstem and fourth ventricle, is within normal limits.  The third and lateral ventricles, and basal ganglia are unremarkable in appearance.  The cerebral hemispheres are symmetric in appearance, with normal gray- white differentiation.  No mass effect or midline shift is seen.  There is no evidence of fracture; visualized osseous structures are unremarkable in appearance.  The visualized portions of the orbits are within normal limits.  The paranasal sinuses and mastoid air cells are well-aerated.  No significant soft tissue abnormalities are seen.  IMPRESSION: Unremarkable noncontrast CT of the head.   Original Report Authenticated By: Tonia Ghent, M.D.     Microbiology: No results found for this or any previous visit (from the past 240 hour(s)).   Labs: Basic Metabolic Panel:  Lab 01/27/12 9147 01/27/12 0131  NA 139 136  K 4.4 3.9  CL 107 103  CO2 24 21  GLUCOSE 114* 101*  BUN 11 13  CREATININE 0.74 0.78  CALCIUM 8.6 9.0  MG -- 1.9  PHOS -- --   Liver Function Tests: No results found for this basename: AST:5,ALT:5,ALKPHOS:5,BILITOT:5,PROT:5,ALBUMIN:5 in  the last 168 hours No results found for this basename: LIPASE:5,AMYLASE:5 in the last 168 hours No results found for this basename: AMMONIA:5 in the last 168 hours CBC:  Lab 01/27/12 0656 01/27/12 0131  WBC 8.4 8.3  NEUTROABS -- 6.6  HGB 12.8 13.0  HCT 37.2 38.7  MCV 89.4 88.0  PLT 254 258   Cardiac Enzymes:  Lab 01/27/12 0134  CKTOTAL --  CKMB --  CKMBINDEX --  TROPONINI <0.30   BNP: BNP (last 3 results) No results found for this basename: PROBNP:3 in the last 8760 hours CBG: No results found for this basename: GLUCAP:5 in the  last 168 hours  Time coordinating discharge: *45 minutes  Signed:  Leandre Wien  [facility] 01/27/2012, 3:46 PM

## 2012-01-27 NOTE — Progress Notes (Signed)
Pt discharged to home per MD order. Pt and husband received and reviewed all discharge instructions and medication information including follow-up appointments and medication information.  Pt alert and oriented at discharge with no complaints of pain. Pt escorted to private vehicle via wheelchair by nurse tech. Efraim Kaufmann

## 2012-01-27 NOTE — ED Provider Notes (Addendum)
History     CSN: [id number]  Arrival date & time 01/27/12  0035   First MD Initiated Contact with Patient 01/27/12 0041      Chief Complaint  Patient presents with  . Loss of Consciousness    (Consider location/radiation/quality/duration/timing/severity/associated sxs/prior treatment) Patient is a 56 y.o. female presenting with syncope. The history is provided by the patient, the spouse and a relative.  Loss of Consciousness Pertinent negatives include no chest pain, no abdominal pain, no headaches and no shortness of breath.   56 year old, female, with no significant past medical history presents to emergency department after a syncopal episode.  She was walking.  Some friends the door.  She lost her vision became lightheaded, and fainted.  Another friend caught her in the lower her to the ground, so, she did not have a traumatic fall.  She was pale and lost continence of her urine.  She recovered.  Her mental status.  Within a few minutes.  She denies pain, or palpitations.  Prior to fainting.  She denies recent illness.  She has not had nausea, vomiting, fevers, chills, cough.  Now.  She feels back to normal.  She denies recent travel or surgery.  She does not take oral contraceptives.  Past Medical History  Diagnosis Date  . History of ovarian cyst     Past Surgical History  Procedure Date  . Hernia repair 04/29/2007    inguinal rt     Family History  Problem Relation Age of Onset  . Coronary artery disease    . Parkinsonism    . Other      low hdl    History  Substance Use Topics  . Smoking status: Never Smoker   . Smokeless tobacco: Not on file  . Alcohol Use: 3.5 oz/week    7 drink(s) per week     Comment: socially    OB History    Grav Para Term Preterm Abortions TAB SAB Ect Mult Living                  Review of Systems  Constitutional: Negative for fever, chills and diaphoresis.  HENT: Negative for neck pain and neck stiffness.   Eyes: Positive for  visual disturbance.  Respiratory: Negative for cough, chest tightness and shortness of breath.   Cardiovascular: Positive for syncope. Negative for chest pain, palpitations and leg swelling.  Gastrointestinal: Negative for nausea, vomiting, abdominal pain and diarrhea.  Genitourinary: Negative for dysuria.  Musculoskeletal: Negative for back pain.  Skin: Positive for pallor.  Neurological: Positive for syncope. Negative for headaches.  Hematological: Does not bruise/bleed easily.  Psychiatric/Behavioral: Negative for confusion.  All other systems reviewed and are negative.    Allergies  Review of patient's allergies indicates no known allergies.  Home Medications   Current Outpatient Rx  Name  Route  Sig  Dispense  Refill  . FOLIC ACID 1 MG PO TABS   Oral   Take 1 mg by mouth daily.           . MULTIPLE VITAMIN PO   Oral   Take 1 tablet by mouth daily.            BP 124/76  Pulse 99  Temp 98.2 F (36.8 C) (Oral)  Resp 17  SpO2 100%  LMP 01/20/2012  Physical Exam  Nursing note and vitals reviewed. Constitutional: She is oriented to person, place, and time. She appears well-developed and well-nourished. No distress.  HENT:  Head: Normocephalic  and atraumatic.  Eyes: Conjunctivae normal and EOM are normal.  Neck: Normal range of motion. Neck supple. No JVD present.       Carotid bruit on the right  Cardiovascular: Regular rhythm and intact distal pulses.   No murmur heard.      Tachycardia  Pulmonary/Chest: Effort normal and breath sounds normal. No respiratory distress.  Abdominal: Soft. Bowel sounds are normal. She exhibits no distension. There is no tenderness. There is no guarding.  Musculoskeletal: Normal range of motion. She exhibits no edema and no tenderness.  Neurological: She is alert and oriented to person, place, and time. No cranial nerve deficit.  Skin: Skin is warm and dry. No pallor.  Psychiatric: She has a normal mood and affect. Thought  content normal.    ED Course  Procedures (including critical care time) 56 year old, healthy, female, with no past medical history presents after a syncopal episode.  Presently, she has a normal neurological examination, and mental status, but she has a sinus tachycardia, and bruit on the right.  Carotid artery  Labs Reviewed  BASIC METABOLIC PANEL  CBC WITH DIFFERENTIAL  URINALYSIS, ROUTINE W REFLEX MICROSCOPIC  TROPONIN I  MAGNESIUM  D-DIMER, QUANTITATIVE   No results found.   No diagnosis found.  ECG. Normal sinus rhythm at 97 beats per minute. Normal axis. Normal intervals. Nonspecific T-wave changes  3:03 AM Spoke with Dr. Toniann Fail. He will admit.  MDM  Syncope        Cheri Guppy, MD 01/27/12 0139  Cheri Guppy, MD 01/27/12 1610

## 2012-01-27 NOTE — Progress Notes (Signed)
*  PRELIMINARY RESULTS* Vascular Ultrasound Carotid Duplex (Doppler) has been completed.  Preliminary findings: Bilateral: No evidence of hemodynamically significant internal carotid artery stenosis (<40%). Antegrade vertebral flow.     Farrel Demark, RDMS, RVT  01/27/2012, 11:04 AM

## 2012-01-27 NOTE — ED Notes (Signed)
Pt to ED via [facility] after reported having a syncopal episode with urinary incontinence.   Pt denies having recent illness.  Pt had been cooking all day then had dinner tonight with friends. Pt was walking friends outside when she became lightheaded, sat down then had syncopal episode lasting approx 10-15 seconds.  When she woke up she appeared to be normal.  At this time pt. Alert and oriented with no complaints.  Only c/o feeling tired.

## 2012-01-27 NOTE — Care Management Note (Unsigned)
    Page 1 of 1   01/27/2012     12:29:24 PM   CARE MANAGEMENT NOTE 01/27/2012  Patient:  Emma Day   Account Number:  [id number]  Date Initiated:  01/27/2012  Documentation initiated by:  GRAVES-BIGELOW,Etha Stambaugh  Subjective/Objective Assessment:   Pt admitted with syncope.     Action/Plan:   CM will f/u for disposition needs.   Anticipated DC Date:  01/28/2012   Anticipated DC Plan:  HOME/SELF CARE         Choice offered to / List presented to:             Status of service:  In process, will continue to follow Medicare Important Message given?   (If response is "NO", the following Medicare IM given date fields will be blank) Date Medicare IM given:   Date Additional Medicare IM given:    Discharge Disposition:    Per UR Regulation:  Reviewed for med. necessity/level of care/duration of stay  If discussed at Long Length of Stay Meetings, dates discussed:    Comments:

## 2012-02-06 ENCOUNTER — Telehealth: Payer: Self-pay | Admitting: Internal Medicine

## 2012-02-06 NOTE — Telephone Encounter (Signed)
If this needs to be done before x mas would have to do as a work in   130 on tues dec 10th and  Hold the  330 slot the  Last appt left for catch up or ask only.   Other wise have plenty of other slots after x mas.

## 2012-02-06 NOTE — Telephone Encounter (Signed)
appt scheduled

## 2012-02-06 NOTE — Telephone Encounter (Signed)
Patient called stating that she need an appt for a post er as she fainted and her heart stopped and they want her to follow up within a week from wed. Last week. Please advise.

## 2012-02-10 ENCOUNTER — Encounter: Payer: Self-pay | Admitting: Internal Medicine

## 2012-02-10 ENCOUNTER — Ambulatory Visit (INDEPENDENT_AMBULATORY_CARE_PROVIDER_SITE_OTHER): Payer: BC Managed Care – PPO | Admitting: Internal Medicine

## 2012-02-10 VITALS — BP 140/92 | HR 68 | Temp 98.2°F | Wt 137.0 lb

## 2012-02-10 DIAGNOSIS — Z8249 Family history of ischemic heart disease and other diseases of the circulatory system: Secondary | ICD-10-CM

## 2012-02-10 DIAGNOSIS — R55 Syncope and collapse: Secondary | ICD-10-CM

## 2012-02-10 NOTE — Progress Notes (Signed)
Chief Complaint  Patient presents with  . Follow-up    Syncope hospital LOC    HPI: Pt comes  in for fu of ed visit on  11 26 when she presented to ed   With syncope  After turning around everything went black  And sat down and felt like passing out vision went back and then did.  remembering family trying to pick her up and went out again .  Then no pulse and low pulse. And then bp went up.   Had incontinence with this episode and was sweating at that time Hands and feet were cold.  Ct neg doppler and echo ok  Labs and ua ok  No repeat dizziness had tingling in feet and hands and ringing in ears.  Denies sig ha vision hearing issues otherwise or focal weakness of mental cloudiness .denies know prodrome of palpitations chest pain sob anxiety  No bleeding bruising . No new meds no sig etoh ROS: See pertinent positives and negatives per HPI.as per  hpi 12 system review Has a family hx of CV disease neg neuro. No prev hx of similar except once when young felt faint under hot conditions?  Past Medical History  Diagnosis Date  . History of ovarian cyst     Family History  Problem Relation Age of Onset  . Coronary artery disease    . Parkinsonism    . Other      low hdl    History   Social History  . Marital Status: Married    Spouse Name: N/A    Number of Children: N/A  . Years of Education: N/A   Social History Main Topics  . Smoking status: Never Smoker   . Smokeless tobacco: None  . Alcohol Use: 3.5 oz/week    7 drink(s) per week     Comment: socially  . Drug Use: No  . Sexually Active: None   Other Topics Concern  . None   Social History Narrative   MarriedG2P2HH of  2   After august.   Son to [employer]. Pet 2 dogs and 1 horse.     Outpatient Encounter Prescriptions as of 02/10/2012  Medication Sig Dispense Refill  . folic acid (FOLVITE) 1 MG tablet Take 1 mg by mouth daily.        . MULTIPLE VITAMIN PO Take 1 tablet by mouth daily.         EXAM:  BP 140/92   Pulse 68  Temp 98.2 F (36.8 C)  Wt 137 lb (62.143 kg)  LMP 01/20/2012  There is no height on file to calculate BMI.  GENERAL: vitals reviewed and listed above, alert, oriented, appears well hydrated and in no acute distress  HEENT: Normocephalic ;atraumatic , Eyes;  PERRL, EOMs  Full, lids and conjunctiva clear,,Ears: no deformities, canals nl, TM landmarks normal, Nose: no deformity or discharge  Mouth : OP clear without lesion or edema .   NECK: no obvious masses on inspection palpation  No bruit heard today  LUNGS: clear to auscultation bilaterally, no wheezes, rales or rhonchi, good air movement  CV: HRRR, no clubbing cyanosis or  peripheral edema nl cap refill  Abdomen:  Sof,t normal bowel sounds without hepatosplenomegaly, no guarding rebound or masses no CVA tenderness MS: moves all extremities without noticeable focal  abnormality NEURO: oriented x 3 CN 3-12 appear intact. No focal muscle weakness or atrophy. DTRs symmetrical. Gait WNL.  Grossly non focal. No tremor or abnormal movement. Neg  rhomberg and nl F to N  Tandem gait no drift PSYCH: pleasant and cooperative, no obvious depression or anxiety Reviewed HOSP evaluation Lab Results  Component Value Date   WBC 8.4 01/27/2012   HGB 12.8 01/27/2012   HCT 37.2 01/27/2012   PLT 254 01/27/2012   GLUCOSE 114* 01/27/2012   CHOL 201* 05/26/2011   TRIG 128.0 05/26/2011   HDL 62.30 05/26/2011   LDLDIRECT 123.5 05/26/2011   ALT 17 05/26/2011   AST 17 05/26/2011   NA 139 01/27/2012   K 4.4 01/27/2012   CL 107 01/27/2012   CREATININE 0.74 01/27/2012   BUN 11 01/27/2012   CO2 24 01/27/2012   TSH 2.52 05/26/2011   Orders placed during the hospital encounter of 01/27/12  . EKG 12-LEAD  . EKG 12-LEAD  . EKG     ASSESSMENT AND PLAN:  Discussed the following assessment and plan:  1. Syncope with incontinence   Ambulatory referral to Cardiology, MR Brain W Wo Contrast   nl exam neg w/u so far qt nl but uln fam hx of cv neg  arrythmia could be vagal syncope but a bit unusual to have incontinece and no seizure acitivy  2. Fam hx-ischem heart disease mom  Ambulatory referral to Cardiology, MR Brain W Wo Contrast  ? If could have orthostasis . ? If event monitor indicated . MRI because of hx of incontinence  With LOC although exam looks good today.   -Patient advised to return or notify health care team  immediately if symptoms worsen or persist or new concerns arise.  Patient Instructions  Uncertain why you had loss of consciousness.   will arrange  Mri of the head and cardiology consult for the reasons we discussed . Avoid significant  Alcohol   And dehydration  .  Contact care team immediately if any recurrence .    Neta Mends. Panosh M.D.

## 2012-02-10 NOTE — Patient Instructions (Signed)
Uncertain why you had loss of consciousness.   will arrange  Mri of the head and cardiology consult for the reasons we discussed . Avoid significant  Alcohol   And dehydration  .  Contact care team immediately if any recurrence .

## 2012-02-13 DIAGNOSIS — Z8249 Family history of ischemic heart disease and other diseases of the circulatory system: Secondary | ICD-10-CM | POA: Insufficient documentation

## 2012-02-18 ENCOUNTER — Encounter: Payer: Self-pay | Admitting: Cardiovascular Disease

## 2012-02-18 ENCOUNTER — Ambulatory Visit (INDEPENDENT_AMBULATORY_CARE_PROVIDER_SITE_OTHER): Payer: BC Managed Care – PPO | Admitting: Cardiovascular Disease

## 2012-02-18 VITALS — BP 104/70 | HR 76 | Ht 66.0 in | Wt 139.4 lb

## 2012-02-18 DIAGNOSIS — R55 Syncope and collapse: Secondary | ICD-10-CM

## 2012-02-18 NOTE — Assessment & Plan Note (Signed)
Emma Day presents several weeks after having an episode of syncope. She has a normal echocardiogram. Her EKG is normal. There is no evidence of bradycardia or tachycardia on the overnight telemetry monitor.  I suspect that she had a withdrawal of sympathetic tone as the explanation for her syncope. She may have been slightly dehydrated at that time. She had just gotten up from the couch. I suspected she had a drop of her blood pressure which subsequently led to bradycardia and a rather prolonged episode of syncope. She did have some brief seizure activity but apparently has not had any seizure-like activity since that time. I suspect the clonic tonic movements were do to hypotension.  I doubt that she has any significant arrhythmias but I did offer to place 30 day event monitor on her.  She did not want to monitor at this time and I told her that this was not necessary. If she has another episode of syncope then we will certainly want to place the monitor on her. I think her main therapy here will be to keep her hydrated. She is very good about avoiding salt and in fact may need to add a little salt to her diet. Her blood pressure runs in the low side all the time.  I've asked her to keep hydrated. We'll try to rehydrate with Gatorade or V8 juice and not just plain water.  I'll see her again in 6 months for followup visit. I'll see her sooner she has recurrent episodes.

## 2012-02-18 NOTE — Patient Instructions (Addendum)
Your physician wants you to follow-up in: 6 months with an ekg.  You will receive a reminder letter in the mail two months in advance. If you don't receive a letter, please call our office to schedule the follow-up appointment.

## 2012-02-18 NOTE — Progress Notes (Signed)
Emma Day Date of Birth  02-05-1956       [facility]    [place] [address]  [address], [address] [place], [place]  [address]   [place], [place]  [address] [phone]     [phone]   Fax  [phone]    Fax [phone]  Problem List: 1. Syncopal episode- she was seen in the hospital for an episode of syncope. Echocardiogram is normal. Telemetry monitoring was unremarkable. Carotid Dopplers were normal.  History of Present Illness:  Emma Day is a 56 year old female who presents today for further evaluation of an episode of syncope. This episode occurred approximately 3 weeks ago. She was admitted to the hospital following that and was observed overnight.  The episode occurred after a busy day in attaining. Her family came in and had lots of friends over. They did around 5:00. The cast start sleep around 11:15 and as she stood up to walk several difference to the door she had an episode of tunnel vision, ringing in ears and lightheadedness. She sat down the steps but did not improve. She passed out completely shortly thereafter. Her family helped her to the floor. She had a brief 10-15 seconds of tonic/clonic seizure like activity. She had urinary incontinence. They noted that her heart rate was very slow and that she was quite pale.  EMS was called. When they arrived she slowly woke up and became fairly normal. She was out for a total of 2 minutes they suspect. She did not receive CPR.  She had an echocardiogram which was normal. She was on telemetry monitor which did not reveal any arrhythmias. She carotid duplex scan which was normal.  She has done quite well since that time. She's not had any further episodes. She typically walks for 5 miles several times a week and has done that without any issues. She feels quite well now.  Current Outpatient Prescriptions on File Prior to Visit  Medication Sig Dispense Refill  . folic acid (FOLVITE) 1  MG tablet Take 1 mg by mouth daily.        . MULTIPLE VITAMIN PO Take 1 tablet by mouth daily.         No Known Allergies  Past Medical History  Diagnosis Date  . History of ovarian cyst     Past Surgical History  Procedure Date  . Hernia repair 04/29/2007    inguinal rt     History  Smoking status  . Never Smoker   Smokeless tobacco  . Not on file    History  Alcohol Use  . 3.5 oz/week  . 7 drink(s) per week    Comment: socially    Family History  Problem Relation Age of Onset  . Coronary artery disease    . Parkinsonism    . Other      low hdl    Reviw of Systems:  Reviewed in the HPI.  All other systems are negative.  Physical Exam: Blood pressure 104/70, pulse 76, height 5\' 6"  (1.676 m), weight 139 lb 6.4 oz (63.231 kg), last menstrual period 01/20/2012, SpO2 99.00%. General: Well developed, well nourished, in no acute distress.  Head: Normocephalic, atraumatic, sclera non-icteric, mucus membranes are moist,   Neck: Supple. Carotids are 2 + without bruits. No JVD   Lungs: Clear   Heart: RR, normal S1, S2,   Abdomen: Soft, non-tender, non-distended with normal bowel sounds.  I was able to palpate her abdominal  aorta.  Msk:  Strength and tone are normal   Extremities: No clubbing or cyanosis. No edema.  Distal pedal pulses are 2+ and equal    Neuro: CN II - XII intact.  Alert and oriented X 3.   Psych:  Normal   ECG: 01/27/2012: Normal sinus rhythm at 97 beats a minute. She has no ST or T wave changes.  Assessment / Plan:

## 2012-02-20 ENCOUNTER — Ambulatory Visit
Admission: RE | Admit: 2012-02-20 | Discharge: 2012-02-20 | Disposition: A | Discharge status: Home / Self Care | Payer: BC Managed Care – PPO | Source: Ambulatory Visit | Attending: Internal Medicine | Admitting: Internal Medicine

## 2012-02-20 DIAGNOSIS — Z8249 Family history of ischemic heart disease and other diseases of the circulatory system: Secondary | ICD-10-CM

## 2012-02-20 DIAGNOSIS — R55 Syncope and collapse: Secondary | ICD-10-CM

## 2012-02-20 MED ORDER — GADOBENATE DIMEGLUMINE 529 MG/ML IV SOLN
12.0000 mL | Freq: Once | INTRAVENOUS | Status: AC | PRN
  Administered 2012-02-20: 12 mL via INTRAVENOUS

## 2013-05-10 ENCOUNTER — Other Ambulatory Visit: Payer: Self-pay

## 2013-05-10 DIAGNOSIS — Z1231 Encounter for screening mammogram for malignant neoplasm of breast: Secondary | ICD-10-CM

## 2013-05-25 ENCOUNTER — Ambulatory Visit
Admission: RE | Admit: 2013-05-25 | Discharge: 2013-05-25 | Disposition: A | Discharge status: Home / Self Care | Payer: Self-pay | Source: Ambulatory Visit

## 2013-05-25 DIAGNOSIS — Z1231 Encounter for screening mammogram for malignant neoplasm of breast: Secondary | ICD-10-CM

## 2013-05-26 ENCOUNTER — Other Ambulatory Visit: Payer: Self-pay | Admitting: Internal Medicine

## 2013-05-26 DIAGNOSIS — R928 Other abnormal and inconclusive findings on diagnostic imaging of breast: Secondary | ICD-10-CM

## 2013-06-01 ENCOUNTER — Other Ambulatory Visit: Payer: Self-pay

## 2013-06-01 ENCOUNTER — Other Ambulatory Visit: Payer: Self-pay | Admitting: Internal Medicine

## 2013-06-01 DIAGNOSIS — R928 Other abnormal and inconclusive findings on diagnostic imaging of breast: Secondary | ICD-10-CM

## 2013-06-07 ENCOUNTER — Ambulatory Visit
Admission: RE | Admit: 2013-06-07 | Discharge: 2013-06-07 | Disposition: A | Discharge status: Home / Self Care | Payer: Self-pay | Source: Ambulatory Visit | Attending: Internal Medicine | Admitting: Internal Medicine

## 2013-06-07 DIAGNOSIS — R928 Other abnormal and inconclusive findings on diagnostic imaging of breast: Secondary | ICD-10-CM

## 2015-02-12 ENCOUNTER — Ambulatory Visit (INDEPENDENT_AMBULATORY_CARE_PROVIDER_SITE_OTHER): Payer: BLUE CROSS/BLUE SHIELD | Admitting: Family Medicine

## 2015-02-12 ENCOUNTER — Encounter: Payer: Self-pay | Admitting: Family Medicine

## 2015-02-12 VITALS — BP 121/77 | Temp 98.4°F | Ht 64.5 in | Wt 137.0 lb

## 2015-02-12 DIAGNOSIS — J069 Acute upper respiratory infection, unspecified: Secondary | ICD-10-CM | POA: Diagnosis not present

## 2015-02-12 DIAGNOSIS — Z23 Encounter for immunization: Secondary | ICD-10-CM

## 2015-02-12 DIAGNOSIS — B9789 Other viral agents as the cause of diseases classified elsewhere: Principal | ICD-10-CM

## 2015-02-12 NOTE — Addendum Note (Signed)
Addended by: Leota Jacobsen on: 02/12/2015 01:35 PM   Modules accepted: Orders

## 2015-02-12 NOTE — Progress Notes (Signed)
OFFICE VISIT  02/12/2015   CC: No chief complaint on file.  HPI:    Patient is a 59 y.o. Caucasian female who presents for respiratory complaints. Onset 4 d/a, onset with ST, then lost voice, then feels like cough went into chest lately.  Coughing up some phlegm.  No fevers.  No pain in face or upper teeth.  Tried sudafed cold and benadryl allergy. No hx of smoking.  Past Medical History  Diagnosis Date  . History of ovarian cyst     Past Surgical History  Procedure Laterality Date  . Hernia repair  04/29/2007    inguinal rt     Outpatient Prescriptions Prior to Visit  Medication Sig Dispense Refill  . folic acid (FOLVITE) 1 MG tablet Take 1 mg by mouth daily.      . MULTIPLE VITAMIN PO Take 1 tablet by mouth daily.      No facility-administered medications prior to visit.    No Known Allergies  ROS As per HPI  PE: Blood pressure 121/77, temperature 98.4 F (36.9 C), temperature source Oral, height 5' 4.5" (1.638 m), weight 137 lb (62.143 kg). VS: noted--normal. Gen: alert, NAD, NONTOXIC APPEARING. HEENT: eyes without injection, drainage, or swelling.  Ears: EACs clear, TMs with normal light reflex and landmarks.  Nose: Clear rhinorrhea, with some dried, crusty exudate adherent to mildly injected mucosa.  No purulent d/c.  No paranasal sinus TTP.  No facial swelling.  Throat and mouth without focal lesion.  No pharyngial swelling, erythema, or exudate.   Neck: supple, no LAD.   LUNGS: CTA bilat, nonlabored resps.   CV: RRR, no m/r/g. EXT: no c/c/e SKIN: no rash  LABS:  none  IMPRESSION AND PLAN:  Viral URI with cough. No sign of bacterial infection or RAD. Get otc generic robitussin DM OR Mucinex DM and use as directed on the packaging for cough and congestion. Use otc generic saline nasal spray 2-3 times per day to irrigate/moisturize your nasal passages. May continue antihist/decong.  Flu vaccine given today.  An After Visit Summary was printed and  given to the patient.  FOLLOW UP: Return if symptoms worsen or fail to improve.

## 2015-03-06 ENCOUNTER — Other Ambulatory Visit: Payer: Self-pay

## 2015-03-06 DIAGNOSIS — Z1231 Encounter for screening mammogram for malignant neoplasm of breast: Secondary | ICD-10-CM

## 2015-03-28 ENCOUNTER — Ambulatory Visit
Admission: RE | Admit: 2015-03-28 | Discharge: 2015-03-28 | Disposition: A | Discharge status: Home / Self Care | Payer: BLUE CROSS/BLUE SHIELD | Source: Ambulatory Visit

## 2015-03-28 DIAGNOSIS — Z1231 Encounter for screening mammogram for malignant neoplasm of breast: Secondary | ICD-10-CM

## 2015-08-29 ENCOUNTER — Other Ambulatory Visit (INDEPENDENT_AMBULATORY_CARE_PROVIDER_SITE_OTHER): Payer: BLUE CROSS/BLUE SHIELD

## 2015-08-29 DIAGNOSIS — Z0000 Encounter for general adult medical examination without abnormal findings: Secondary | ICD-10-CM

## 2015-08-29 LAB — BASIC METABOLIC PANEL
BUN: 10 mg/dL (ref 6–23)
CALCIUM: 9.9 mg/dL (ref 8.4–10.5)
CO2: 31 mEq/L (ref 19–32)
CREATININE: 0.76 mg/dL (ref 0.40–1.20)
Chloride: 102 mEq/L (ref 96–112)
GFR: 83.1 mL/min (ref >60.00)
Glucose, Bld: 105 mg/dL — ABNORMAL HIGH (ref 70–99)
Potassium: 4.3 mEq/L (ref 3.5–5.1)
Sodium: 140 mEq/L (ref 135–145)

## 2015-08-29 LAB — LIPID PANEL
CHOL/HDL RATIO: 3
Cholesterol: 207 mg/dL — ABNORMAL HIGH (ref 0–200)
HDL: 69.1 mg/dL (ref >39.00)
LDL Cholesterol: 120 mg/dL — ABNORMAL HIGH (ref 0–99)
NONHDL: 138.29
Triglycerides: 92 mg/dL (ref 0.0–149.0)
VLDL: 18.4 mg/dL (ref 0.0–40.0)

## 2015-08-29 LAB — HEPATIC FUNCTION PANEL
ALK PHOS: 50 U/L (ref 39–117)
ALT: 11 U/L (ref 0–35)
AST: 15 U/L (ref 0–37)
Albumin: 4.4 g/dL (ref 3.5–5.2)
BILIRUBIN DIRECT: 0.1 mg/dL (ref 0.0–0.3)
TOTAL PROTEIN: 7 g/dL (ref 6.0–8.3)
Total Bilirubin: 0.6 mg/dL (ref 0.2–1.2)

## 2015-08-29 LAB — CBC WITH DIFFERENTIAL/PLATELET
BASOS ABS: 0 10*3/uL (ref 0.0–0.1)
Basophils Relative: 0.4 % (ref 0.0–3.0)
EOS ABS: 0.3 10*3/uL (ref 0.0–0.7)
Eosinophils Relative: 5.7 % — ABNORMAL HIGH (ref 0.0–5.0)
HEMATOCRIT: 42.6 % (ref 36.0–46.0)
HEMOGLOBIN: 14.3 g/dL (ref 12.0–15.0)
LYMPHS PCT: 34.1 % (ref 12.0–46.0)
Lymphs Abs: 1.9 10*3/uL (ref 0.7–4.0)
MCHC: 33.6 g/dL (ref 30.0–36.0)
MCV: 87.4 fl (ref 78.0–100.0)
Monocytes Absolute: 0.4 10*3/uL (ref 0.1–1.0)
Monocytes Relative: 7.5 % (ref 3.0–12.0)
Neutro Abs: 2.9 10*3/uL (ref 1.4–7.7)
Neutrophils Relative %: 52.3 % (ref 43.0–77.0)
PLATELETS: 232 10*3/uL (ref 150.0–400.0)
RBC: 4.87 Mil/uL (ref 3.87–5.11)
RDW: 13.4 % (ref 11.5–15.5)
WBC: 5.6 10*3/uL (ref 4.0–10.5)

## 2015-08-29 LAB — TSH: TSH: 3.4 u[IU]/mL (ref 0.35–4.50)

## 2015-09-04 NOTE — Progress Notes (Signed)
Pre visit review using our clinic review tool, if applicable. No additional management support is needed unless otherwise documented below in the visit note.  Chief Complaint  Patient presents with  . Annual Exam    HPI: Patient  Emma Day  60 y.o. comes in today for [facility] visit  Last seen by me [age] for syncope  .  Since that time she's been generally well. His has good tearing and avoids processed foods exercises she hasn't had a Pap smear and a number of years and is still needs to get her colonoscopy. She never followed through. She is to for a tetanus shot also. Sometimes at night she hears a mild ringing in her ear mostly the left with no neurologic signs new sound trauma or obvious hearing loss. It is not pulsatile.   Health Maintenance  Topic Date Due  . COLONOSCOPY  09/24/2007  . PAP SMEAR  12/11/2013  . Hepatitis C Screening  09/04/2016 (Originally 11-Jul-1955)  . HIV Screening  09/04/2016 (Originally 09/23/1970)  . INFLUENZA VACCINE  10/02/2015  . MAMMOGRAM  03/27/2017  . TETANUS/TDAP  09/04/2025   Health Maintenance Review LIFESTYLE:  Exercise:   10 k    Per day . piscetarian  .    Tobacco/ETS:no Alcohol: wine  About  4   Per week  Sugar beverages:  no Sleep:  About 7  Drug use: no HH of  2  Pets  2 old dogs  lmp oct 16   Mom had  parkinsons  And father  Lymphoma   83  Had a colon polyp at 19   ROS:  GEN/ HEENT: No fever, significant weight changes sweats headaches vision problems hearing changes, CV/ PULM; No chest pain shortness of breath cough, syncope,edema  change in exercise tolerance. GI /GU: No adominal pain, vomiting, change in bowel habits. No blood in the stool. No significant GU symptoms. SKIN/HEME: ,no acute skin rashes suspicious lesions or bleeding. No lymphadenopathy, nodules, masses.  NEURO/ PSYCH:  No neurologic signs such as weakness numbness. No depression anxiety. IMM/ Allergy: No unusual infections.  Allergy .   REST of  12 system review negative except as per HPI   Past Medical History  Diagnosis Date  . History of ovarian cyst     Past Surgical History  Procedure Laterality Date  . Hernia repair  04/29/2007    inguinal rt     Family History  Problem Relation Age of Onset  . Coronary artery disease    . Parkinsonism    . Other      low hdl    Social History   Social History  . Marital Status: Married    Spouse Name: N/A  . Number of Children: N/A  . Years of Education: N/A   Social History Main Topics  . Smoking status: Never Smoker   . Smokeless tobacco: Never Used  . Alcohol Use: 4.2 oz/week    7 Standard drinks or equivalent per week     Comment: socially  . Drug Use: No  . Sexual Activity: Not Asked   Other Topics Concern  . None   Social History Narrative   Married   G2P2   HH of  2   After august.   Son to [employer]    Pet 2 dogs and 1 horse.                 Outpatient Prescriptions Prior to Visit  Medication Sig Dispense  Refill  . folic acid (FOLVITE) 1 MG tablet Take 1 mg by mouth daily.      . MULTIPLE VITAMIN PO Take 1 tablet by mouth daily.      No facility-administered medications prior to visit.     EXAM:  BP 120/76 mmHg  Temp(Src) 98.5 F (36.9 C) (Oral)  Ht 5' 4.5" (1.638 m)  Wt 137 lb 14.4 oz (62.551 kg)  BMI 23.31 kg/m2  Body mass index is 23.31 kg/(m^2).  Physical Exam: Vital signs reviewed PXT:GGYI is a well-developed well-nourished alert cooperative    who appearsr stated age in no acute distress.  HEENT: normocephalic atraumatic , Eyes: PERRL EOM's full, conjunctiva clear, Nares: paten,t no deformity discharge or tenderness., Ears: no deformity EAC's clear TMs with normal landmarks. Mouth: clear OP, no lesions, edema.  Moist mucous membranes. Dentition in adequate repair. NECK: supple without masses, thyromegaly or bruits. CHEST/PULM:  Clear to auscultation and percussion breath sounds equal no wheeze , rales or rhonchi. No chest wall  deformities or tenderness.Breast: normal by inspection . No dimpling, discharge, masses, tenderness or discharge . CV: PMI is nondisplaced, S1 S2 no gallops, murmurs, rubs. Peripheral pulses are full without delay.No JVD . Breast: normal by inspection . No dimpling, discharge, masses, tenderness or discharge . ABDOMEN: Bowel sounds normal nontender  No guard or rebound, no hepato splenomegal no CVA tenderness.  No hernia. Extremtities:  No clubbing cyanosis or edema, no acute joint swelling or redness no focal atrophy NEURO:  Oriented x3, cranial nerves 3-12 appear to be intact, no obvious focal weakness,gait within normal limits no abnormal reflexes or asymmetrical SKIN: No acute rashes normal turgor, color, no bruising or petechiae. PSYCH: Oriented, good eye contact, no obvious depression anxiety, cognition and judgment appear normal. LN: no cervical axillary inguinal adenopathy Pelvic: NL ext GU, labia clear without lesions or rash . Vagina no lesions .Cervix: clear  UTERUS: Neg CMT Adnexa:  clear no masses . PAP done HR HPV rectal neg masses    Lab Results  Component Value Date   WBC 5.6 08/29/2015   HGB 14.3 08/29/2015   HCT 42.6 08/29/2015   PLT 232.0 08/29/2015   GLUCOSE 105* 08/29/2015   CHOL 207* 08/29/2015   TRIG 92.0 08/29/2015   HDL 69.10 08/29/2015   LDLDIRECT 123.5 05/26/2011   LDLCALC 120* 08/29/2015   ALT 11 08/29/2015   AST 15 08/29/2015   NA 140 08/29/2015   K 4.3 08/29/2015   CL 102 08/29/2015   CREATININE 0.76 08/29/2015   BUN 10 08/29/2015   CO2 31 08/29/2015   TSH 3.40 08/29/2015    ASSESSMENT AND PLAN:  Discussed the following assessment and plan:  Visit for preventive health examination - Plan: PAP [[place]]  Encounter for routine gynecological examination - lmp oct 16  - Plan: PAP [[place]]  Fasting hyperglycemia - Borderline continue eating healthy diet counseled reviewed follow-up in a year unless needed.  Colon cancer screening - Plan:  Ambulatory referral to Gastroenterology  Need for Tdap vaccination - Plan: Tdap vaccine greater than or equal to 7yo IM  Patient Care Team: Burnis Medin, MD as PCP - General Arvella Nigh, MD (Obstetrics and Gynecology) Patient Instructions  Continue lifestyle intervention healthy eating and exercise . Avoid process sugars as you are doing .    If getting ringing   persistent or progressive can see ent or audiology about  This for hearing  Will notify you when pap results are available.  Health Maintenance, Female Adopting a healthy lifestyle and getting preventive care can go a long way to promote health and wellness. Talk with your health care provider about what schedule of regular examinations is right for you. This is a good chance for you to check in with your provider about disease prevention and staying healthy. In between checkups, there are plenty of things you can do on your own. Experts have done a lot of research about which lifestyle changes and preventive measures are most likely to keep you healthy. Ask your health care provider for more information. WEIGHT AND DIET  Eat a healthy diet  Be sure to include plenty of vegetables, fruits, low-fat dairy products, and lean protein.  Do not eat a lot of foods high in solid fats, added sugars, or salt.  Get regular exercise. This is one of the most important things you can do for your health.  Most adults should exercise for at least 150 minutes each week. The exercise should increase your heart rate and make you sweat (moderate-intensity exercise).  Most adults should also do strengthening exercises at least twice a week. This is in addition to the moderate-intensity exercise.  Maintain a healthy weight  Body mass index (BMI) is a measurement that can be used to identify possible weight problems. It estimates body fat based on height and weight. Your health care provider can help determine your BMI and help you achieve  or maintain a healthy weight.  For females 22 years of age and older:   A BMI below 18.5 is considered underweight.  A BMI of 18.5 to 24.9 is normal.  A BMI of 25 to 29.9 is considered overweight.  A BMI of 30 and above is considered obese.  Watch levels of cholesterol and blood lipids  You should start having your blood tested for lipids and cholesterol at 58 years of age, then have this test every 5 years.  You may need to have your cholesterol levels checked more often if:  Your lipid or cholesterol levels are high.  You are older than 58 years of age.  You are at high risk for heart disease.  CANCER SCREENING   Lung Cancer  Lung cancer screening is recommended for adults 25-61 years old who are at high risk for lung cancer because of a history of smoking.  A yearly low-dose CT scan of the lungs is recommended for people who:  Currently smoke.  Have quit within the past 15 years.  Have at least a 30-pack-year history of smoking. A pack year is smoking an average of one pack of cigarettes a day for 1 year.  Yearly screening should continue until it has been 15 years since you quit.  Yearly screening should stop if you develop a health problem that would prevent you from having lung cancer treatment.  Breast Cancer  Practice breast self-awareness. This means understanding how your breasts normally appear and feel.  It also means doing regular breast self-exams. Let your health care provider know about any changes, no matter how small.  If you are in your 20s or 30s, you should have a clinical breast exam (CBE) by a health care provider every 1-3 years as part of a regular health exam.  If you are 75 or older, have a CBE every year. Also consider having a breast X-ray (mammogram) every year.  If you have a family history of breast cancer, talk to your health care provider about genetic screening.  If you  are at high risk for breast cancer, talk to your health  care provider about having an MRI and a mammogram every year.  Breast cancer gene (BRCA) assessment is recommended for women who have family members with BRCA-related cancers. BRCA-related cancers include:  Breast.  Ovarian.  Tubal.  Peritoneal cancers.  Results of the assessment will determine the need for genetic counseling and BRCA1 and BRCA2 testing. Cervical Cancer Your health care provider may recommend that you be screened regularly for cancer of the pelvic organs (ovaries, uterus, and vagina). This screening involves a pelvic examination, including checking for microscopic changes to the surface of your cervix (Pap test). You may be encouraged to have this screening done every 3 years, beginning at age 42.  For women ages 34-65, health care providers may recommend pelvic exams and Pap testing every 3 years, or they may recommend the Pap and pelvic exam, combined with testing for human papilloma virus (HPV), every 5 years. Some types of HPV increase your risk of cervical cancer. Testing for HPV may also be done on women of any age with unclear Pap test results.  Other health care providers may not recommend any screening for nonpregnant women who are considered low risk for pelvic cancer and who do not have symptoms. Ask your health care provider if a screening pelvic exam is right for you.  If you have had past treatment for cervical cancer or a condition that could lead to cancer, you need Pap tests and screening for cancer for at least 20 years after your treatment. If Pap tests have been discontinued, your risk factors (such as having a new sexual partner) need to be reassessed to determine if screening should resume. Some women have medical problems that increase the chance of getting cervical cancer. In these cases, your health care provider may recommend more frequent screening and Pap tests. Colorectal Cancer  This type of cancer can be detected and often prevented.  Routine  colorectal cancer screening usually begins at 58 years of age and continues through 58 years of age.  Your health care provider may recommend screening at an earlier age if you have risk factors for colon cancer.  Your health care provider may also recommend using home test kits to check for hidden blood in the stool.  A small camera at the end of a tube can be used to examine your colon directly (sigmoidoscopy or colonoscopy). This is done to check for the earliest forms of colorectal cancer.  Routine screening usually begins at age 47.  Direct examination of the colon should be repeated every 5-10 years through 58 years of age. However, you may need to be screened more often if early forms of precancerous polyps or small growths are found. Skin Cancer  Check your skin from head to toe regularly.  Tell your health care provider about any new moles or changes in moles, especially if there is a change in a mole's shape or color.  Also tell your health care provider if you have a mole that is larger than the size of a pencil eraser.  Always use sunscreen. Apply sunscreen liberally and repeatedly throughout the day.  Protect yourself by wearing long sleeves, pants, a wide-brimmed hat, and sunglasses whenever you are outside. HEART DISEASE, DIABETES, AND HIGH BLOOD PRESSURE   High blood pressure causes heart disease and increases the risk of stroke. High blood pressure is more likely to develop in:  People who have blood pressure in the high end  of the normal range (130-139/85-89 mm Hg).  People who are overweight or obese.  People who are African American.  If you are 76-22 years of age, have your blood pressure checked every 3-5 years. If you are 69 years of age or older, have your blood pressure checked every year. You should have your blood pressure measured twice--once when you are at a hospital or clinic, and once when you are not at a hospital or clinic. Record the average of the  two measurements. To check your blood pressure when you are not at a hospital or clinic, you can use:  An automated blood pressure machine at a pharmacy.  A home blood pressure monitor.  If you are between 75 years and 76 years old, ask your health care provider if you should take aspirin to prevent strokes.  Have regular diabetes screenings. This involves taking a blood sample to check your fasting blood sugar level.  If you are at a normal weight and have a low risk for diabetes, have this test once every three years after 58 years of age.  If you are overweight and have a high risk for diabetes, consider being tested at a younger age or more often. PREVENTING INFECTION  Hepatitis B  If you have a higher risk for hepatitis B, you should be screened for this virus. You are considered at high risk for hepatitis B if:  You were born in a country where hepatitis B is common. Ask your health care provider which countries are considered high risk.  Your parents were born in a high-risk country, and you have not been immunized against hepatitis B (hepatitis B vaccine).  You have HIV or AIDS.  You use needles to inject street drugs.  You live with someone who has hepatitis B.  You have had sex with someone who has hepatitis B.  You get hemodialysis treatment.  You take certain medicines for conditions, including cancer, organ transplantation, and autoimmune conditions. Hepatitis C  Blood testing is recommended for:  Everyone born from [birth year] through 1963.  Anyone with known risk factors for hepatitis C. Sexually transmitted infections (STIs)  You should be screened for sexually transmitted infections (STIs) including gonorrhea and chlamydia if:  You are sexually active and are younger than 58 years of age.  You are older than 58 years of age and your health care provider tells you that you are at risk for this type of infection.  Your sexual activity has changed since you were  last screened and you are at an increased risk for chlamydia or gonorrhea. Ask your health care provider if you are at risk.  If you do not have HIV, but are at risk, it may be recommended that you take a prescription medicine daily to prevent HIV infection. This is called pre-exposure prophylaxis (PrEP). You are considered at risk if:  You are sexually active and do not regularly use condoms or know the HIV status of your partner(s).  You take drugs by injection.  You are sexually active with a partner who has HIV. Talk with your health care provider about whether you are at high risk of being infected with HIV. If you choose to begin PrEP, you should first be tested for HIV. You should then be tested every 3 months for as long as you are taking PrEP.  PREGNANCY   If you are premenopausal and you may become pregnant, ask your health care provider about preconception counseling.  If you may  become pregnant, take 400 to 800 micrograms (mcg) of folic acid every day.  If you want to prevent pregnancy, talk to your health care provider about birth control (contraception). OSTEOPOROSIS AND MENOPAUSE   Osteoporosis is a disease in which the bones lose minerals and strength with aging. This can result in serious bone fractures. Your risk for osteoporosis can be identified using a bone density scan.  If you are 86 years of age or older, or if you are at risk for osteoporosis and fractures, ask your health care provider if you should be screened.  Ask your health care provider whether you should take a calcium or vitamin D supplement to lower your risk for osteoporosis.  Menopause may have certain physical symptoms and risks.  Hormone replacement therapy may reduce some of these symptoms and risks. Talk to your health care provider about whether hormone replacement therapy is right for you.  HOME CARE INSTRUCTIONS   Schedule regular health, dental, and eye exams.  Stay current with your  immunizations.   Do not use any tobacco products including cigarettes, chewing tobacco, or electronic cigarettes.  If you are pregnant, do not drink alcohol.  If you are breastfeeding, limit how much and how often you drink alcohol.  Limit alcohol intake to no more than 1 drink per day for nonpregnant women. One drink equals 12 ounces of beer, 5 ounces of wine, or 1 ounces of hard liquor.  Do not use street drugs.  Do not share needles.  Ask your health care provider for help if you need support or information about quitting drugs.  Tell your health care provider if you often feel depressed.  Tell your health care provider if you have ever been abused or do not feel safe at home.   This information is not intended to replace advice given to you by your health care provider. Make sure you discuss any questions you have with your health care provider.   Document Released: 09/02/2010 Document Revised: 03/10/2014 Document Reviewed: 01/19/2013 Elsevier Interactive Patient Education 2016 [place] protect organs, store calcium, and anchor muscles. Good health habits, such as eating nutritious foods and exercising regularly, are important for maintaining healthy bones. They can also help to prevent a condition that causes bones to lose density and become weak and brittle (osteoporosis). WHY IS BONE MASS IMPORTANT? Bone mass refers to the amount of bone tissue that you have. The higher your bone mass, the stronger your bones. An important step toward having healthy bones throughout life is to have strong and dense bones during childhood. A young adult who has a high bone mass is more likely to have a high bone mass later in life. Bone mass at its greatest it is called peak bone mass. A large decline in bone mass occurs in older adults. In women, it occurs about the time of menopause. During this time, it is important to practice good health habits, because if more bone  is lost than what is replaced, the bones will become less healthy and more likely to break (fracture). If you find that you have a low bone mass, you may be able to prevent osteoporosis or further bone loss by changing your diet and lifestyle. HOW CAN I FIND OUT IF MY BONE MASS IS LOW? Bone mass can be measured with an X-ray test that is called a bone mineral density (BMD) test. This test is recommended for all women who are age 82 or older. It  may also be recommended for men who are age 65 or older, or for people who are more likely to develop osteoporosis due to:  Having bones that break easily.  Having a long-term disease that weakens bones, such as kidney disease or rheumatoid arthritis.  Having menopause earlier than normal.  Taking medicine that weakens bones, such as steroids, thyroid hormones, or hormone treatment for breast cancer or prostate cancer.  Smoking.  Drinking three or more alcoholic drinks each day. WHAT ARE THE NUTRITIONAL RECOMMENDATIONS FOR HEALTHY BONES? To have healthy bones, you need to get enough of the right minerals and vitamins. Most nutrition experts recommend getting these nutrients from the foods that you eat. Nutritional recommendations vary from person to person. Ask your health care provider what is healthy for you. Here are some general guidelines. Calcium Recommendations Calcium is the most important (essential) mineral for bone health. Most people can get enough calcium from their diet, but supplements may be recommended for people who are at risk for osteoporosis. Good sources of calcium include:  Dairy products, such as low-fat or nonfat milk, cheese, and yogurt.  Dark green leafy vegetables, such as bok choy and broccoli.  Calcium-fortified foods, such as orange juice, cereal, bread, soy beverages, and tofu products.  Nuts, such as almonds. Follow these recommended amounts for daily calcium intake:  Children, age 45-3: 700 mg.  Children, age  60-8: 1,000 mg.  Children, age 54-13: 1,300 mg.  Teens, age 62-18: 1,300 mg.  Adults, age 64-50: 1,000 mg.  Adults, age 71-70:  Men: 1,000 mg.  Women: 1,200 mg.  Adults, age 66 or older: 1,200 mg.  Pregnant and breastfeeding females:  Teens: 1,300 mg.  Adults: 1,000 mg. Vitamin D Recommendations Vitamin D is the most essential vitamin for bone health. It helps the body to absorb calcium. Sunlight stimulates the skin to make vitamin D, so be sure to get enough sunlight. If you live in a cold climate or you do not get outside often, your health care provider may recommend that you take vitamin D supplements. Good sources of vitamin D in your diet include:  Egg yolks.  Saltwater fish.  Milk and cereal fortified with vitamin D. Follow these recommended amounts for daily vitamin D intake:  Children and teens, age 45-18: 600 international units.  Adults, age 53 or younger: 400-800 international units.  Adults, age 36 or older: 800-1,000 international units. Other Nutrients Other nutrients for bone health include:  Phosphorus. This mineral is found in meat, poultry, dairy foods, nuts, and legumes. The recommended daily intake for adult men and adult women is 700 mg.  Magnesium. This mineral is found in seeds, nuts, dark green vegetables, and legumes. The recommended daily intake for adult men is 400-420 mg. For adult women, it is 310-320 mg.  Vitamin K. This vitamin is found in green leafy vegetables. The recommended daily intake is 120 mg for adult men and 90 mg for adult women. WHAT TYPE OF PHYSICAL ACTIVITY IS BEST FOR BUILDING AND MAINTAINING HEALTHY BONES? Weight-bearing and strength-building activities are important for building and maintaining peak bone mass. Weight-bearing activities cause muscles and bones to work against gravity. Strength-building activities increases muscle strength that supports bones. Weight-bearing and muscle-building activities include:  Walking  and hiking.  Jogging and running.  Dancing.  Gym exercises.  Lifting weights.  Tennis and racquetball.  Climbing stairs.  Aerobics. Adults should get at least 30 minutes of moderate physical activity on most days. Children should get at least  60 minutes of moderate physical activity on most days. Ask your health care provide what type of exercise is best for you. WHERE CAN I FIND MORE INFORMATION? For more information, check out the following websites:  Index: [employer]  [employer] of Health: http://www.niams.AnonymousEar.fr.asp   This information is not intended to replace advice given to you by your health care provider. Make sure you discuss any questions you have with your health care provider.   Document Released: 05/10/2003 Document Revised: 07/04/2014 Document Reviewed: 02/22/2014 Elsevier Interactive Patient Education 2016 [place] K. Panosh M.D.

## 2015-09-04 NOTE — Patient Instructions (Addendum)
Continue lifestyle intervention healthy eating and exercise . Avoid process sugars as you are doing .    If getting ringing   persistent or progressive can see ent or audiology about  This for hearing  Will notify you when pap results are available.     Health Maintenance, Female Adopting a healthy lifestyle and getting preventive care can go a long way to promote health and wellness. Talk with your health care provider about what schedule of regular examinations is right for you. This is a good chance for you to check in with your provider about disease prevention and staying healthy. In between checkups, there are plenty of things you can do on your own. Experts have done a lot of research about which lifestyle changes and preventive measures are most likely to keep you healthy. Ask your health care provider for more information. WEIGHT AND DIET  Eat a healthy diet  Be sure to include plenty of vegetables, fruits, low-fat dairy products, and lean protein.  Do not eat a lot of foods high in solid fats, added sugars, or salt.  Get regular exercise. This is one of the most important things you can do for your health.  Most adults should exercise for at least 150 minutes each week. The exercise should increase your heart rate and make you sweat (moderate-intensity exercise).  Most adults should also do strengthening exercises at least twice a week. This is in addition to the moderate-intensity exercise.  Maintain a healthy weight  Body mass index (BMI) is a measurement that can be used to identify possible weight problems. It estimates body fat based on height and weight. Your health care provider can help determine your BMI and help you achieve or maintain a healthy weight.  For females 4 years of age and older:   A BMI below 18.5 is considered underweight.  A BMI of 18.5 to 24.9 is normal.  A BMI of 25 to 29.9 is considered overweight.  A BMI of 30 and above is considered obese.   Watch levels of cholesterol and blood lipids  You should start having your blood tested for lipids and cholesterol at 58 years of age, then have this test every 5 years.  You may need to have your cholesterol levels checked more often if:  Your lipid or cholesterol levels are high.  You are older than 58 years of age.  You are at high risk for heart disease.  CANCER SCREENING   Lung Cancer  Lung cancer screening is recommended for adults 67-47 years old who are at high risk for lung cancer because of a history of smoking.  A yearly low-dose CT scan of the lungs is recommended for people who:  Currently smoke.  Have quit within the past 15 years.  Have at least a 30-pack-year history of smoking. A pack year is smoking an average of one pack of cigarettes a day for 1 year.  Yearly screening should continue until it has been 15 years since you quit.  Yearly screening should stop if you develop a health problem that would prevent you from having lung cancer treatment.  Breast Cancer  Practice breast self-awareness. This means understanding how your breasts normally appear and feel.  It also means doing regular breast self-exams. Let your health care provider know about any changes, no matter how small.  If you are in your 20s or 30s, you should have a clinical breast exam (CBE) by a health care provider every 1-3 years  as part of a regular health exam.  If you are 27 or older, have a CBE every year. Also consider having a breast X-ray (mammogram) every year.  If you have a family history of breast cancer, talk to your health care provider about genetic screening.  If you are at high risk for breast cancer, talk to your health care provider about having an MRI and a mammogram every year.  Breast cancer gene (BRCA) assessment is recommended for women who have family members with BRCA-related cancers. BRCA-related cancers  include:  Breast.  Ovarian.  Tubal.  Peritoneal cancers.  Results of the assessment will determine the need for genetic counseling and BRCA1 and BRCA2 testing. Cervical Cancer Your health care provider may recommend that you be screened regularly for cancer of the pelvic organs (ovaries, uterus, and vagina). This screening involves a pelvic examination, including checking for microscopic changes to the surface of your cervix (Pap test). You may be encouraged to have this screening done every 3 years, beginning at age 77.  For women ages 17-65, health care providers may recommend pelvic exams and Pap testing every 3 years, or they may recommend the Pap and pelvic exam, combined with testing for human papilloma virus (HPV), every 5 years. Some types of HPV increase your risk of cervical cancer. Testing for HPV may also be done on women of any age with unclear Pap test results.  Other health care providers may not recommend any screening for nonpregnant women who are considered low risk for pelvic cancer and who do not have symptoms. Ask your health care provider if a screening pelvic exam is right for you.  If you have had past treatment for cervical cancer or a condition that could lead to cancer, you need Pap tests and screening for cancer for at least 20 years after your treatment. If Pap tests have been discontinued, your risk factors (such as having a new sexual partner) need to be reassessed to determine if screening should resume. Some women have medical problems that increase the chance of getting cervical cancer. In these cases, your health care provider may recommend more frequent screening and Pap tests. Colorectal Cancer  This type of cancer can be detected and often prevented.  Routine colorectal cancer screening usually begins at 58 years of age and continues through 58 years of age.  Your health care provider may recommend screening at an earlier age if you have risk factors for  colon cancer.  Your health care provider may also recommend using home test kits to check for hidden blood in the stool.  A small camera at the end of a tube can be used to examine your colon directly (sigmoidoscopy or colonoscopy). This is done to check for the earliest forms of colorectal cancer.  Routine screening usually begins at age 7.  Direct examination of the colon should be repeated every 5-10 years through 58 years of age. However, you may need to be screened more often if early forms of precancerous polyps or small growths are found. Skin Cancer  Check your skin from head to toe regularly.  Tell your health care provider about any new moles or changes in moles, especially if there is a change in a mole's shape or color.  Also tell your health care provider if you have a mole that is larger than the size of a pencil eraser.  Always use sunscreen. Apply sunscreen liberally and repeatedly throughout the day.  Protect yourself by wearing long sleeves,  pants, a wide-brimmed hat, and sunglasses whenever you are outside. HEART DISEASE, DIABETES, AND HIGH BLOOD PRESSURE   High blood pressure causes heart disease and increases the risk of stroke. High blood pressure is more likely to develop in:  People who have blood pressure in the high end of the normal range (130-139/85-89 mm Hg).  People who are overweight or obese.  People who are African American.  If you are 24-66 years of age, have your blood pressure checked every 3-5 years. If you are 81 years of age or older, have your blood pressure checked every year. You should have your blood pressure measured twice--once when you are at a hospital or clinic, and once when you are not at a hospital or clinic. Record the average of the two measurements. To check your blood pressure when you are not at a hospital or clinic, you can use:  An automated blood pressure machine at a pharmacy.  A home blood pressure monitor.  If you  are between 16 years and 28 years old, ask your health care provider if you should take aspirin to prevent strokes.  Have regular diabetes screenings. This involves taking a blood sample to check your fasting blood sugar level.  If you are at a normal weight and have a low risk for diabetes, have this test once every three years after 58 years of age.  If you are overweight and have a high risk for diabetes, consider being tested at a younger age or more often. PREVENTING INFECTION  Hepatitis B  If you have a higher risk for hepatitis B, you should be screened for this virus. You are considered at high risk for hepatitis B if:  You were born in a country where hepatitis B is common. Ask your health care provider which countries are considered high risk.  Your parents were born in a high-risk country, and you have not been immunized against hepatitis B (hepatitis B vaccine).  You have HIV or AIDS.  You use needles to inject street drugs.  You live with someone who has hepatitis B.  You have had sex with someone who has hepatitis B.  You get hemodialysis treatment.  You take certain medicines for conditions, including cancer, organ transplantation, and autoimmune conditions. Hepatitis C  Blood testing is recommended for:  Everyone born from [birth year] through 1963.  Anyone with known risk factors for hepatitis C. Sexually transmitted infections (STIs)  You should be screened for sexually transmitted infections (STIs) including gonorrhea and chlamydia if:  You are sexually active and are younger than 58 years of age.  You are older than 58 years of age and your health care provider tells you that you are at risk for this type of infection.  Your sexual activity has changed since you were last screened and you are at an increased risk for chlamydia or gonorrhea. Ask your health care provider if you are at risk.  If you do not have HIV, but are at risk, it may be recommended that you  take a prescription medicine daily to prevent HIV infection. This is called pre-exposure prophylaxis (PrEP). You are considered at risk if:  You are sexually active and do not regularly use condoms or know the HIV status of your partner(s).  You take drugs by injection.  You are sexually active with a partner who has HIV. Talk with your health care provider about whether you are at high risk of being infected with HIV. If you choose to  begin PrEP, you should first be tested for HIV. You should then be tested every 3 months for as long as you are taking PrEP.  PREGNANCY   If you are premenopausal and you may become pregnant, ask your health care provider about preconception counseling.  If you may become pregnant, take 400 to 800 micrograms (mcg) of folic acid every day.  If you want to prevent pregnancy, talk to your health care provider about birth control (contraception). OSTEOPOROSIS AND MENOPAUSE   Osteoporosis is a disease in which the bones lose minerals and strength with aging. This can result in serious bone fractures. Your risk for osteoporosis can be identified using a bone density scan.  If you are 67 years of age or older, or if you are at risk for osteoporosis and fractures, ask your health care provider if you should be screened.  Ask your health care provider whether you should take a calcium or vitamin D supplement to lower your risk for osteoporosis.  Menopause may have certain physical symptoms and risks.  Hormone replacement therapy may reduce some of these symptoms and risks. Talk to your health care provider about whether hormone replacement therapy is right for you.  HOME CARE INSTRUCTIONS   Schedule regular health, dental, and eye exams.  Stay current with your immunizations.   Do not use any tobacco products including cigarettes, chewing tobacco, or electronic cigarettes.  If you are pregnant, do not drink alcohol.  If you are breastfeeding, limit how  much and how often you drink alcohol.  Limit alcohol intake to no more than 1 drink per day for nonpregnant women. One drink equals 12 ounces of beer, 5 ounces of wine, or 1 ounces of hard liquor.  Do not use street drugs.  Do not share needles.  Ask your health care provider for help if you need support or information about quitting drugs.  Tell your health care provider if you often feel depressed.  Tell your health care provider if you have ever been abused or do not feel safe at home.   This information is not intended to replace advice given to you by your health care provider. Make sure you discuss any questions you have with your health care provider.   Document Released: 09/02/2010 Document Revised: 03/10/2014 Document Reviewed: 01/19/2013 Elsevier Interactive Patient Education 2016 [place] protect organs, store calcium, and anchor muscles. Good health habits, such as eating nutritious foods and exercising regularly, are important for maintaining healthy bones. They can also help to prevent a condition that causes bones to lose density and become weak and brittle (osteoporosis). WHY IS BONE MASS IMPORTANT? Bone mass refers to the amount of bone tissue that you have. The higher your bone mass, the stronger your bones. An important step toward having healthy bones throughout life is to have strong and dense bones during childhood. A young adult who has a high bone mass is more likely to have a high bone mass later in life. Bone mass at its greatest it is called peak bone mass. A large decline in bone mass occurs in older adults. In women, it occurs about the time of menopause. During this time, it is important to practice good health habits, because if more bone is lost than what is replaced, the bones will become less healthy and more likely to break (fracture). If you find that you have a low bone mass, you may be able to prevent osteoporosis or further bone  loss by changing your diet and lifestyle. HOW CAN I FIND OUT IF MY BONE MASS IS LOW? Bone mass can be measured with an X-ray test that is called a bone mineral density (BMD) test. This test is recommended for all women who are age 23 or older. It may also be recommended for men who are age 65 or older, or for people who are more likely to develop osteoporosis due to:  Having bones that break easily.  Having a long-term disease that weakens bones, such as kidney disease or rheumatoid arthritis.  Having menopause earlier than normal.  Taking medicine that weakens bones, such as steroids, thyroid hormones, or hormone treatment for breast cancer or prostate cancer.  Smoking.  Drinking three or more alcoholic drinks each day. WHAT ARE THE NUTRITIONAL RECOMMENDATIONS FOR HEALTHY BONES? To have healthy bones, you need to get enough of the right minerals and vitamins. Most nutrition experts recommend getting these nutrients from the foods that you eat. Nutritional recommendations vary from person to person. Ask your health care provider what is healthy for you. Here are some general guidelines. Calcium Recommendations Calcium is the most important (essential) mineral for bone health. Most people can get enough calcium from their diet, but supplements may be recommended for people who are at risk for osteoporosis. Good sources of calcium include:  Dairy products, such as low-fat or nonfat milk, cheese, and yogurt.  Dark green leafy vegetables, such as bok choy and broccoli.  Calcium-fortified foods, such as orange juice, cereal, bread, soy beverages, and tofu products.  Nuts, such as almonds. Follow these recommended amounts for daily calcium intake:  Children, age 66-3: 700 mg.  Children, age 787-8: 1,000 mg.  Children, age 73-13: 1,300 mg.  Teens, age 96-18: 1,300 mg.  Adults, age 35-50: 1,000 mg.  Adults, age 58-70:  Men: 1,000 mg.  Women: 1,200 mg.  Adults, age 24 or older: 1,200  mg.  Pregnant and breastfeeding females:  Teens: 1,300 mg.  Adults: 1,000 mg. Vitamin D Recommendations Vitamin D is the most essential vitamin for bone health. It helps the body to absorb calcium. Sunlight stimulates the skin to make vitamin D, so be sure to get enough sunlight. If you live in a cold climate or you do not get outside often, your health care provider may recommend that you take vitamin D supplements. Good sources of vitamin D in your diet include:  Egg yolks.  Saltwater fish.  Milk and cereal fortified with vitamin D. Follow these recommended amounts for daily vitamin D intake:  Children and teens, age 66-18: 600 international units.  Adults, age 86 or younger: 400-800 international units.  Adults, age 30 or older: 800-1,000 international units. Other Nutrients Other nutrients for bone health include:  Phosphorus. This mineral is found in meat, poultry, dairy foods, nuts, and legumes. The recommended daily intake for adult men and adult women is 700 mg.  Magnesium. This mineral is found in seeds, nuts, dark green vegetables, and legumes. The recommended daily intake for adult men is 400-420 mg. For adult women, it is 310-320 mg.  Vitamin K. This vitamin is found in green leafy vegetables. The recommended daily intake is 120 mg for adult men and 90 mg for adult women. WHAT TYPE OF PHYSICAL ACTIVITY IS BEST FOR BUILDING AND MAINTAINING HEALTHY BONES? Weight-bearing and strength-building activities are important for building and maintaining peak bone mass. Weight-bearing activities cause muscles and bones to work against gravity. Strength-building activities increases muscle strength that supports bones.  Weight-bearing and muscle-building activities include:  Walking and hiking.  Jogging and running.  Dancing.  Gym exercises.  Lifting weights.  Tennis and racquetball.  Climbing stairs.  Aerobics. Adults should get at least 30 minutes of moderate physical  activity on most days. Children should get at least 60 minutes of moderate physical activity on most days. Ask your health care provide what type of exercise is best for you. WHERE CAN I FIND MORE INFORMATION? For more information, check out the following websites:  [place]: [employer].se  [employer] of Health: http://www.niams.AnonymousEar.fr.asp   This information is not intended to replace advice given to you by your health care provider. Make sure you discuss any questions you have with your health care provider.   Document Released: 05/10/2003 Document Revised: 07/04/2014 Document Reviewed: 02/22/2014 Elsevier Interactive Patient Education [employer].

## 2015-09-05 ENCOUNTER — Encounter: Payer: Self-pay | Admitting: Internal Medicine

## 2015-09-05 ENCOUNTER — Other Ambulatory Visit (HOSPITAL_COMMUNITY)
Admission: RE | Admit: 2015-09-05 | Discharge: 2015-09-05 | Disposition: A | Discharge status: Home / Self Care | Payer: BLUE CROSS/BLUE SHIELD | Source: Ambulatory Visit | Attending: Internal Medicine | Admitting: Internal Medicine

## 2015-09-05 ENCOUNTER — Ambulatory Visit (INDEPENDENT_AMBULATORY_CARE_PROVIDER_SITE_OTHER): Payer: BLUE CROSS/BLUE SHIELD | Admitting: Internal Medicine

## 2015-09-05 VITALS — BP 120/76 | Temp 98.5°F | Ht 64.5 in | Wt 137.9 lb

## 2015-09-05 DIAGNOSIS — Z1211 Encounter for screening for malignant neoplasm of colon: Secondary | ICD-10-CM | POA: Diagnosis not present

## 2015-09-05 DIAGNOSIS — Z23 Encounter for immunization: Secondary | ICD-10-CM

## 2015-09-05 DIAGNOSIS — R7301 Impaired fasting glucose: Secondary | ICD-10-CM | POA: Diagnosis not present

## 2015-09-05 DIAGNOSIS — Z01419 Encounter for gynecological examination (general) (routine) without abnormal findings: Secondary | ICD-10-CM | POA: Diagnosis present

## 2015-09-05 DIAGNOSIS — Z1151 Encounter for screening for human papillomavirus (HPV): Secondary | ICD-10-CM | POA: Diagnosis present

## 2015-09-05 DIAGNOSIS — Z0000 Encounter for general adult medical examination without abnormal findings: Secondary | ICD-10-CM

## 2015-09-06 ENCOUNTER — Encounter: Payer: Self-pay | Admitting: Gastroenterology

## 2015-09-07 LAB — CYTOLOGY - PAP

## 2015-09-07 NOTE — Progress Notes (Signed)
Quick Note:  Tell patient PAP is normal. HPV high risk is negative ______ 

## 2015-09-10 ENCOUNTER — Encounter: Payer: Self-pay | Admitting: Family Medicine

## 2015-10-25 ENCOUNTER — Ambulatory Visit (AMBULATORY_SURGERY_CENTER): Payer: Self-pay | Admitting: *Deleted

## 2015-10-25 VITALS — Ht 66.0 in | Wt 136.6 lb

## 2015-10-25 DIAGNOSIS — Z1211 Encounter for screening for malignant neoplasm of colon: Secondary | ICD-10-CM

## 2015-10-25 MED ORDER — NA SULFATE-K SULFATE-MG SULF 17.5-3.13-1.6 GM/177ML PO SOLN: 1.0000 | Freq: Once | ORAL | 0 refills | Status: AC

## 2015-11-01 ENCOUNTER — Encounter: Payer: Self-pay | Admitting: Gastroenterology

## 2015-11-12 ENCOUNTER — Ambulatory Visit (AMBULATORY_SURGERY_CENTER): Payer: BLUE CROSS/BLUE SHIELD | Admitting: Gastroenterology

## 2015-11-12 ENCOUNTER — Encounter: Payer: Self-pay | Admitting: Gastroenterology

## 2015-11-12 VITALS — BP 106/64 | HR 73 | Temp 98.0°F | Resp 11 | Ht 66.0 in | Wt 136.0 lb

## 2015-11-12 DIAGNOSIS — D123 Benign neoplasm of transverse colon: Secondary | ICD-10-CM

## 2015-11-12 DIAGNOSIS — Z1211 Encounter for screening for malignant neoplasm of colon: Secondary | ICD-10-CM

## 2015-11-12 HISTORY — PX: COLONOSCOPY: SHX174

## 2015-11-12 MED ORDER — SODIUM CHLORIDE 0.9 % IV SOLN: 500.0000 mL | INTRAVENOUS | Status: DC

## 2015-11-12 NOTE — Progress Notes (Signed)
To PACU  Pt awake and alert, pleased with MAC, Report to RN

## 2015-11-12 NOTE — Op Note (Signed)
[facility] Patient Name: Emma Day Procedure Date: 11/12/2015 9:52 AM MRN: [id number] Endoscopist: Ladene Artist , MD Age: 60 Referring MD:  Date of Birth: 01-Sep-1955 Gender: Female Account #: [id number] Procedure:                Colonoscopy Indications:              Screening for colorectal malignant neoplasm, This                            is the patient's first colonoscopy Medicines:                Monitored Anesthesia Care Procedure:                Pre-Anesthesia Assessment:                           - Prior to the procedure, a History and Physical                            was performed, and patient medications and                            allergies were reviewed. The patient's tolerance of                            previous anesthesia was also reviewed. The risks                            and benefits of the procedure and the sedation                            options and risks were discussed with the patient.                            All questions were answered, and informed consent                            was obtained. Prior Anticoagulants: The patient has                            taken no previous anticoagulant or antiplatelet                            agents. ASA Grade Assessment: II - A patient with                            mild systemic disease. After reviewing the risks                            and benefits, the patient was deemed in                            satisfactory condition to undergo the procedure.  After obtaining informed consent, the colonoscope                            was passed under direct vision. Throughout the                            procedure, the patient's blood pressure, pulse, and                            oxygen saturations were monitored continuously. The                            Model PCF-H190L [id number]) scope was introduced                            through the anus and  advanced to the the cecum,                            identified by appendiceal orifice and ileocecal                            valve. The ileocecal valve, appendiceal orifice,                            and rectum were photographed. The quality of the                            bowel preparation was adequate. The colonoscopy was                            performed without difficulty. The patient tolerated                            the procedure well. Scope In: 10:02:48 AM Scope Out: 10:19:50 AM Scope Withdrawal Time: 0 hours 12 minutes 17 seconds  Total Procedure Duration: 0 hours 17 minutes 2 seconds  Findings:                 Two sessile polyps were found in the transverse                            colon. The polyps were 7 to 8 mm in size. These                            polyps were removed with a cold snare. Resection                            and retrieval were complete.                           The exam was otherwise without abnormality on                            direct and retroflexion views.  A few medium-mouthed diverticula were found in the                            sigmoid colon. There was no evidence of                            diverticular bleeding. Complications:            No immediate complications. Estimated blood loss:                            None. Estimated Blood Loss:     Estimated blood loss: none. Impression:               - Two 7 to 8 mm polyps in the transverse colon,                            removed with a cold snare. Resected and retrieved.                           - The examination was otherwise normal on direct                            and retroflexion views.                           - Mild diverticulosis in the sigmoid colon. There                            was no evidence of diverticular bleeding. Recommendation:           - Repeat colonoscopy with a more extensive bowel                            prep in 5  years for surveillance if polyp(s) are                            precancerous, otherwise 10 years.                           - Patient has a contact number available for                            emergencies. The signs and symptoms of potential                            delayed complications were discussed with the                            patient. Return to normal activities tomorrow.                            Written discharge instructions were provided to the  patient.                           - High fiber diet.                           - Continue present medications.                           - Await pathology results. Ladene Artist, MD 11/12/2015 10:24:29 AM This report has been signed electronically.

## 2015-11-12 NOTE — Patient Instructions (Signed)
YOU HAD AN ENDOSCOPIC PROCEDURE TODAY AT THE [facility]:   Refer to the procedure report that was given to you for any specific questions about what was found during the examination.  If the procedure report does not answer your questions, please call your gastroenterologist to clarify.  If you requested that your care partner not be given the details of your procedure findings, then the procedure report has been included in a sealed envelope for you to review at your convenience later.  YOU SHOULD EXPECT: Some feelings of bloating in the abdomen. Passage of more gas than usual.  Walking can help get rid of the air that was put into your GI tract during the procedure and reduce the bloating. If you had a lower endoscopy (such as a colonoscopy or flexible sigmoidoscopy) you may notice spotting of blood in your stool or on the toilet paper. If you underwent a bowel prep for your procedure, you may not have a normal bowel movement for a few days.  Please Note:  You might notice some irritation and congestion in your nose or some drainage.  This is from the oxygen used during your procedure.  There is no need for concern and it should clear up in a day or so.  SYMPTOMS TO REPORT IMMEDIATELY:   Following lower endoscopy (colonoscopy or flexible sigmoidoscopy):  Excessive amounts of blood in the stool  Significant tenderness or worsening of abdominal pains  Swelling of the abdomen that is new, acute  Fever of 100F or higher    For urgent or emergent issues, a gastroenterologist can be reached at any hour by calling [phone].   DIET:  We do recommend a small meal at first, but then you may proceed to your regular diet.  Drink plenty of fluids but you should avoid alcoholic beverages for 24 hours.  ACTIVITY:  You should plan to take it easy for the rest of today and you should NOT DRIVE or use heavy machinery until tomorrow (because of the sedation medicines used during the test).     FOLLOW UP: Our staff will call the number listed on your records the next business day following your procedure to check on you and address any questions or concerns that you may have regarding the information given to you following your procedure. If we do not reach you, we will leave a message.  However, if you are feeling well and you are not experiencing any problems, there is no need to return our call.  We will assume that you have returned to your regular daily activities without incident.  If any biopsies were taken you will be contacted by phone or by letter within the next 1-3 weeks.  Please call us at [phone] if you have not heard about the biopsies in 3 weeks.    SIGNATURES/CONFIDENTIALITY: You and/or your care partner have signed paperwork which will be entered into your electronic medical record.  These signatures attest to the fact that that the information above on your After Visit Summary has been reviewed and is understood.  Full responsibility of the confidentiality of this discharge information lies with you and/or your care-partner.   Resume medications. Information given on polyps,diverticulosis and high fiber diet. 

## 2015-11-12 NOTE — Progress Notes (Signed)
Called to room to assist during endoscopic procedure.  Patient ID and intended procedure confirmed with present staff. Received instructions for my participation in the procedure from the performing physician.  

## 2015-11-13 ENCOUNTER — Telehealth: Payer: Self-pay

## 2015-11-13 NOTE — Telephone Encounter (Signed)
  Follow up Call-  Call back number 11/12/2015  Post procedure Call Back phone  # [phone] cell  Permission to leave phone message Yes  Some recent data might be hidden    Patient was called for follow up after her procedure on 11/12/2015. No answer at the number given for follow up phone call. A message was left on the answering machine.

## 2015-11-20 ENCOUNTER — Encounter: Payer: Self-pay | Admitting: Gastroenterology

## 2016-06-11 ENCOUNTER — Other Ambulatory Visit: Payer: Self-pay | Admitting: Internal Medicine

## 2016-06-11 DIAGNOSIS — Z1231 Encounter for screening mammogram for malignant neoplasm of breast: Secondary | ICD-10-CM

## 2016-07-15 ENCOUNTER — Ambulatory Visit
Admission: RE | Admit: 2016-07-15 | Discharge: 2016-07-15 | Disposition: A | Discharge status: Home / Self Care | Payer: BLUE CROSS/BLUE SHIELD | Source: Ambulatory Visit | Attending: Internal Medicine | Admitting: Internal Medicine

## 2016-07-15 DIAGNOSIS — Z1231 Encounter for screening mammogram for malignant neoplasm of breast: Secondary | ICD-10-CM

## 2017-06-25 ENCOUNTER — Other Ambulatory Visit: Payer: Self-pay | Admitting: Internal Medicine

## 2017-06-25 DIAGNOSIS — Z1231 Encounter for screening mammogram for malignant neoplasm of breast: Secondary | ICD-10-CM

## 2017-07-20 ENCOUNTER — Ambulatory Visit
Admission: RE | Admit: 2017-07-20 | Discharge: 2017-07-20 | Disposition: A | Discharge status: Home / Self Care | Payer: BLUE CROSS/BLUE SHIELD | Source: Ambulatory Visit | Attending: Internal Medicine | Admitting: Internal Medicine

## 2017-07-20 DIAGNOSIS — Z1231 Encounter for screening mammogram for malignant neoplasm of breast: Secondary | ICD-10-CM

## 2017-09-08 ENCOUNTER — Encounter: Payer: Self-pay | Admitting: Internal Medicine

## 2017-09-08 ENCOUNTER — Ambulatory Visit (INDEPENDENT_AMBULATORY_CARE_PROVIDER_SITE_OTHER): Payer: BLUE CROSS/BLUE SHIELD | Admitting: Internal Medicine

## 2017-09-08 VITALS — BP 113/75 | HR 69 | Temp 98.4°F | Ht 64.5 in | Wt 131.0 lb

## 2017-09-08 DIAGNOSIS — Z13220 Encounter for screening for lipoid disorders: Secondary | ICD-10-CM | POA: Diagnosis not present

## 2017-09-08 DIAGNOSIS — Z0000 Encounter for general adult medical examination without abnormal findings: Secondary | ICD-10-CM

## 2017-09-08 DIAGNOSIS — Z1159 Encounter for screening for other viral diseases: Secondary | ICD-10-CM

## 2017-09-08 DIAGNOSIS — Z8249 Family history of ischemic heart disease and other diseases of the circulatory system: Secondary | ICD-10-CM | POA: Diagnosis not present

## 2017-09-08 LAB — BASIC METABOLIC PANEL
BUN: 11 mg/dL (ref 6–23)
CO2: 30 meq/L (ref 19–32)
Calcium: 9.7 mg/dL (ref 8.4–10.5)
Chloride: 104 mEq/L (ref 96–112)
Creatinine, Ser: 0.64 mg/dL (ref 0.40–1.20)
GFR: 100.62 mL/min (ref >60.00)
GLUCOSE: 97 mg/dL (ref 70–99)
POTASSIUM: 4 meq/L (ref 3.5–5.1)
SODIUM: 142 meq/L (ref 135–145)

## 2017-09-08 LAB — HEPATIC FUNCTION PANEL
ALK PHOS: 61 U/L (ref 39–117)
ALT: 12 U/L (ref 0–35)
AST: 15 U/L (ref 0–37)
Albumin: 4.8 g/dL (ref 3.5–5.2)
Bilirubin, Direct: 0.1 mg/dL (ref 0.0–0.3)
Total Bilirubin: 0.6 mg/dL (ref 0.2–1.2)
Total Protein: 7.2 g/dL (ref 6.0–8.3)

## 2017-09-08 LAB — CBC WITH DIFFERENTIAL/PLATELET
Basophils Absolute: 0 10*3/uL (ref 0.0–0.1)
Basophils Relative: 0.6 % (ref 0.0–3.0)
EOS PCT: 2.7 % (ref 0.0–5.0)
Eosinophils Absolute: 0.1 10*3/uL (ref 0.0–0.7)
HEMATOCRIT: 42.8 % (ref 36.0–46.0)
Hemoglobin: 14.4 g/dL (ref 12.0–15.0)
LYMPHS ABS: 1.3 10*3/uL (ref 0.7–4.0)
LYMPHS PCT: 26 % (ref 12.0–46.0)
MCHC: 33.7 g/dL (ref 30.0–36.0)
MCV: 90.8 fl (ref 78.0–100.0)
MONOS PCT: 7.4 % (ref 3.0–12.0)
Monocytes Absolute: 0.4 10*3/uL (ref 0.1–1.0)
NEUTROS PCT: 63.3 % (ref 43.0–77.0)
Neutro Abs: 3.1 10*3/uL (ref 1.4–7.7)
Platelets: 206 10*3/uL (ref 150.0–400.0)
RBC: 4.71 Mil/uL (ref 3.87–5.11)
RDW: 13.2 % (ref 11.5–15.5)
WBC: 4.9 10*3/uL (ref 4.0–10.5)

## 2017-09-08 LAB — LIPID PANEL
CHOLESTEROL: 206 mg/dL — AB (ref 0–200)
HDL: 72.6 mg/dL (ref >39.00)
LDL Cholesterol: 114 mg/dL — ABNORMAL HIGH (ref 0–99)
NONHDL: 133.48
Total CHOL/HDL Ratio: 3
Triglycerides: 98 mg/dL (ref 0.0–149.0)
VLDL: 19.6 mg/dL (ref 0.0–40.0)

## 2017-09-08 LAB — TSH: TSH: 2.56 u[IU]/mL (ref 0.35–4.50)

## 2017-09-08 NOTE — Progress Notes (Signed)
Chief Complaint  Patient presents with  . Annual Exam    HPI: Patient  Emma Day  62 y.o. comes in today for [place] visit  Doing well  No new concerns   Health Maintenance  Topic Date Due  . Hepatitis C Screening  04-18-1955  . HIV Screening  09/23/1972  . INFLUENZA VACCINE  10/01/2017  . PAP SMEAR  09/05/2018  . MAMMOGRAM  07/21/2019  . COLONOSCOPY  11/11/2020  . 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 GI /GU: No adominal pain, vomiting, change in bowel habits. No blood in the stool. No significant GU symptoms. SKIN/HEME: ,no acute skin rashes suspicious lesions or bleeding. No lymphadenopathy, nodules, masses.  NEURO/ PSYCH:  No neurologic signs such as weakness numbness. No depression anxiety. IMM/ Allergy: No unusual infections.  Allergy .   REST of 12 system review negative except as per HPI   Past Medical History:  Diagnosis Date  . History of ovarian cyst   . Syncope     Past Surgical History:  Procedure Laterality Date  . HERNIA REPAIR  04/29/2007   inguinal rt     Family History  Problem Relation Age of Onset  . Colon polyps Father        pt's father was in his 79's. lg polyp removed surgically and part of colon was removed.  . Coronary artery disease Unknown   . Parkinsonism Unknown   . Other Unknown        low hdl  . Breast cancer Maternal Grandmother   . Colon cancer Neg Hx   . Esophageal cancer Neg Hx   . Rectal cancer Neg Hx   . Stomach  cancer Neg Hx     Social History   Socioeconomic History  . Marital status: Married    Spouse name: Not on file  . Number of children: Not on file  . Years of education: Not on file  . Highest education level: Not on file  Occupational History  . Not on file  Social Needs  . Financial resource strain: Not on file  . Food insecurity:    Worry: Not on file    Inability: Not on file  . Transportation needs:    Medical: Not on file    Non-medical: Not on file  Tobacco Use  . Smoking status: Never Smoker  . Smokeless tobacco: Never Used  Substance and Sexual Activity  . Alcohol use: Yes    Alcohol/week: 4.2 oz    Types: 7 Standard drinks or equivalent per week    Comment: socially  . Drug use: No  . Sexual activity: Not on file  Lifestyle  . Physical activity:    Days per week: Not on file    Minutes per session: Not on file  . Stress: Not on file  Relationships  . Social connections:    Talks on phone: Not on file  Gets together: Not on file    Attends religious service: Not on file    Active member of club or organization: Not on file    Attends meetings of clubs or organizations: Not on file    Relationship status: Not on file  Other Topics Concern  . Not on file  Social History Narrative   Married   G2P2   HH of  2   After august.   Son to [employer]    Pet 2 dogs and 1 horse.                 Outpatient Medications Prior to Visit  Medication Sig Dispense Refill  . folic acid (FOLVITE) 1 MG tablet Take 1 mg by mouth daily.      . MULTIPLE VITAMIN PO Take 1 tablet by mouth daily.      Facility-Administered Medications Prior to Visit  Medication Dose Route Frequency Provider Last Rate Last Dose  . 0.9 %  sodium chloride infusion  500 mL Intravenous Continuous Ladene Artist, MD         EXAM:  BP 113/75   Pulse 69   Temp 98.4 F (36.9 C)   Ht 5' 4.5" (1.638 m)   Wt 131 lb (59.4 kg)   LMP 12/02/2014   BMI 22.14 kg/m   Body mass index is  22.14 kg/m. Wt Readings from Last 3 Encounters:  09/08/17 131 lb (59.4 kg)  11/12/15 136 lb (61.7 kg)  10/25/15 136 lb 9.6 oz (62 kg)    Physical Exam: Vital signs reviewed HFW:YOVZ is a well-developed well-nourished alert cooperative    who appearsr stated age in no acute distress.  HEENT: normocephalic atraumatic , Eyes: PERRL EOM's full, conjunctiva clear, Nares: paten,t no deformity discharge or tenderness., Ears: no deformity EAC's clear TMs with normal landmarks. Mouth: clear OP, no lesions, edema.  Moist mucous membranes. Dentition in adequate repair. NECK: supple without masses, thyromegaly or bruits. CHEST/PULM:  Clear to auscultation and percussion breath sounds equal no wheeze , rales or rhonchi. No chest wall deformities or tenderness. Breast: normal by inspection . No dimpling, discharge, masses, tenderness or discharge . CV: PMI is nondisplaced, S1 S2 no gallops, murmurs, rubs. Peripheral pulses are full without delay.No JVD .  ABDOMEN: Bowel sounds normal nontender  No guard or rebound, no hepato splenomegal no CVA tenderness.  No hernia. Extremtities:  No clubbing cyanosis or edema, no acute joint swelling or redness no focal atrophy NEURO:  Oriented x3, cranial nerves 3-12 appear to be intact, no obvious focal weakness,gait within normal limits no abnormal reflexes or asymmetrical SKIN: No acute rashes normal turgor, color, no bruising or petechiae. PSYCH: Oriented, good eye contact, no obvious depression anxiety, cognition and judgment appear normal. LN: no cervical axillary inguinal adenopathy    BP Readings from Last 3 Encounters:  09/08/17 113/75  11/12/15 106/64  09/05/15 120/76    ASSESSMENT AND PLAN:  Discussed the following assessment and plan:  Visit for preventive health examination - Plan: Basic metabolic panel, CBC with Differential/Platelet, Hepatic function panel, Lipid panel, TSH, Hepatitis C antibody  Fam hx-ischem heart disease mom - Plan: Basic  metabolic panel, CBC with Differential/Platelet, Hepatic function panel, Lipid panel, TSH, Hepatitis C antibody  Encounter for hepatitis C screening test for low risk patient - Plan: Hepatitis C antibody  Screening, lipid - Plan: Basic metabolic panel, CBC with Differential/Platelet, Hepatic function panel, Lipid panel, TSH, Hepatitis C antibody Continue lifestyle intervention healthy eating and  exercise .  Patient Care Team: Burnis Medin, MD as PCP - Andria Meuse, MD (Obstetrics and Gynecology) Patient Instructions   Last pap 2017  Due in 5 years or less   Will notify you  of labs when available.   Continue lifestyle intervention healthy eating and exercise . Get enough calcium vit d in  Diet and . Continue heart healthy  Habits    Health Maintenance, Female Adopting a healthy lifestyle and getting preventive care can go a long way to promote health and wellness. Talk with your health care provider about what schedule of regular examinations is right for you. This is a good chance for you to check in with your provider about disease prevention and staying healthy. In between checkups, there are plenty of things you can do on your own. Experts have done a lot of research about which lifestyle changes and preventive measures are most likely to keep you healthy. Ask your health care provider for more information. Weight and diet Eat a healthy diet  Be sure to include plenty of vegetables, fruits, low-fat dairy products, and lean protein.  Do not eat a lot of foods high in solid fats, added sugars, or salt.  Get regular exercise. This is one of the most important things you can do for your health. ? Most adults should exercise for at least 150 minutes each week. The exercise should increase your heart rate and make you sweat (moderate-intensity exercise). ? Most adults should also do strengthening exercises at least twice a week. This is in addition to the moderate-intensity  exercise.  Maintain a healthy weight  Body mass index (BMI) is a measurement that can be used to identify possible weight problems. It estimates body fat based on height and weight. Your health care provider can help determine your BMI and help you achieve or maintain a healthy weight.  For females 20 years of age and older: ? A BMI below 18.5 is considered underweight. ? A BMI of 18.5 to 24.9 is normal. ? A BMI of 25 to 29.9 is considered overweight. ? A BMI of 30 and above is considered obese.  Watch levels of cholesterol and blood lipids  You should start having your blood tested for lipids and cholesterol at 60 years of age, then have this test every 5 years.  You may need to have your cholesterol levels checked more often if: ? Your lipid or cholesterol levels are high. ? You are older than 60 years of age. ? You are at high risk for heart disease.  Cancer screening Lung Cancer  Lung cancer screening is recommended for adults 3-68 years old who are at high risk for lung cancer because of a history of smoking.  A yearly low-dose CT scan of the lungs is recommended for people who: ? Currently smoke. ? Have quit within the past 15 years. ? Have at least a 30-pack-year history of smoking. A pack year is smoking an average of one pack of cigarettes a day for 1 year.  Yearly screening should continue until it has been 15 years since you quit.  Yearly screening should stop if you develop a health problem that would prevent you from having lung cancer treatment.  Breast Cancer  Practice breast self-awareness. This means understanding how your breasts normally appear and feel.  It also means doing regular breast self-exams. Let your health care provider know about any changes, no matter how small.  If you are in your 23s  or 57s, you should have a clinical breast exam (CBE) by a health care provider every 1-3 years as part of a regular health exam.  If you are 108 or older, have  a CBE every year. Also consider having a breast X-ray (mammogram) every year.  If you have a family history of breast cancer, talk to your health care provider about genetic screening.  If you are at high risk for breast cancer, talk to your health care provider about having an MRI and a mammogram every year.  Breast cancer gene (BRCA) assessment is recommended for women who have family members with BRCA-related cancers. BRCA-related cancers include: ? Breast. ? Ovarian. ? Tubal. ? Peritoneal cancers.  Results of the assessment will determine the need for genetic counseling and BRCA1 and BRCA2 testing.  Cervical Cancer Your health care provider may recommend that you be screened regularly for cancer of the pelvic organs (ovaries, uterus, and vagina). This screening involves a pelvic examination, including checking for microscopic changes to the surface of your cervix (Pap test). You may be encouraged to have this screening done every 3 years, beginning at age 64.  For women ages 40-65, health care providers may recommend pelvic exams and Pap testing every 3 years, or they may recommend the Pap and pelvic exam, combined with testing for human papilloma virus (HPV), every 5 years. Some types of HPV increase your risk of cervical cancer. Testing for HPV may also be done on women of any age with unclear Pap test results.  Other health care providers may not recommend any screening for nonpregnant women who are considered low risk for pelvic cancer and who do not have symptoms. Ask your health care provider if a screening pelvic exam is right for you.  If you have had past treatment for cervical cancer or a condition that could lead to cancer, you need Pap tests and screening for cancer for at least 20 years after your treatment. If Pap tests have been discontinued, your risk factors (such as having a new sexual partner) need to be reassessed to determine if screening should resume. Some women have  medical problems that increase the chance of getting cervical cancer. In these cases, your health care provider may recommend more frequent screening and Pap tests.  Colorectal Cancer  This type of cancer can be detected and often prevented.  Routine colorectal cancer screening usually begins at 60 years of age and continues through 60 years of age.  Your health care provider may recommend screening at an earlier age if you have risk factors for colon cancer.  Your health care provider may also recommend using home test kits to check for hidden blood in the stool.  A small camera at the end of a tube can be used to examine your colon directly (sigmoidoscopy or colonoscopy). This is done to check for the earliest forms of colorectal cancer.  Routine screening usually begins at age 91.  Direct examination of the colon should be repeated every 5-10 years through 60 years of age. However, you may need to be screened more often if early forms of precancerous polyps or small growths are found.  Skin Cancer  Check your skin from head to toe regularly.  Tell your health care provider about any new moles or changes in moles, especially if there is a change in a mole's shape or color.  Also tell your health care provider if you have a mole that is larger than the size of a  pencil eraser.  Always use sunscreen. Apply sunscreen liberally and repeatedly throughout the day.  Protect yourself by wearing long sleeves, pants, a wide-brimmed hat, and sunglasses whenever you are outside.  Heart disease, diabetes, and high blood pressure  High blood pressure causes heart disease and increases the risk of stroke. High blood pressure is more likely to develop in: ? People who have blood pressure in the high end of the normal range (130-139/85-89 mm Hg). ? People who are overweight or obese. ? People who are African American.  If you are 38-59 years of age, have your blood pressure checked every 3-5  years. If you are 87 years of age or older, have your blood pressure checked every year. You should have your blood pressure measured twice-once when you are at a hospital or clinic, and once when you are not at a hospital or clinic. Record the average of the two measurements. To check your blood pressure when you are not at a hospital or clinic, you can use: ? An automated blood pressure machine at a pharmacy. ? A home blood pressure monitor.  If you are between 10 years and 47 years old, ask your health care provider if you should take aspirin to prevent strokes.  Have regular diabetes screenings. This involves taking a blood sample to check your fasting blood sugar level. ? If you are at a normal weight and have a low risk for diabetes, have this test once every three years after 60 years of age. ? If you are overweight and have a high risk for diabetes, consider being tested at a younger age or more often. Preventing infection Hepatitis B  If you have a higher risk for hepatitis B, you should be screened for this virus. You are considered at high risk for hepatitis B if: ? You were born in a country where hepatitis B is common. Ask your health care provider which countries are considered high risk. ? Your parents were born in a high-risk country, and you have not been immunized against hepatitis B (hepatitis B vaccine). ? You have HIV or AIDS. ? You use needles to inject street drugs. ? You live with someone who has hepatitis B. ? You have had sex with someone who has hepatitis B. ? You get hemodialysis treatment. ? You take certain medicines for conditions, including cancer, organ transplantation, and autoimmune conditions.  Hepatitis C  Blood testing is recommended for: ? Everyone born from [birth year] through 1963. ? Anyone with known risk factors for hepatitis C.  Sexually transmitted infections (STIs)  You should be screened for sexually transmitted infections (STIs) including  gonorrhea and chlamydia if: ? You are sexually active and are younger than 60 years of age. ? You are older than 60 years of age and your health care provider tells you that you are at risk for this type of infection. ? Your sexual activity has changed since you were last screened and you are at an increased risk for chlamydia or gonorrhea. Ask your health care provider if you are at risk.  If you do not have HIV, but are at risk, it may be recommended that you take a prescription medicine daily to prevent HIV infection. This is called pre-exposure prophylaxis (PrEP). You are considered at risk if: ? You are sexually active and do not regularly use condoms or know the HIV status of your partner(s). ? You take drugs by injection. ? You are sexually active with a partner who has HIV.  Talk with your health care provider about whether you are at high risk of being infected with HIV. If you choose to begin PrEP, you should first be tested for HIV. You should then be tested every 3 months for as long as you are taking PrEP. Pregnancy  If you are premenopausal and you may become pregnant, ask your health care provider about preconception counseling.  If you may become pregnant, take 400 to 800 micrograms (mcg) of folic acid every day.  If you want to prevent pregnancy, talk to your health care provider about birth control (contraception). Osteoporosis and menopause  Osteoporosis is a disease in which the bones lose minerals and strength with aging. This can result in serious bone fractures. Your risk for osteoporosis can be identified using a bone density scan.  If you are 86 years of age or older, or if you are at risk for osteoporosis and fractures, ask your health care provider if you should be screened.  Ask your health care provider whether you should take a calcium or vitamin D supplement to lower your risk for osteoporosis.  Menopause may have certain physical symptoms and  risks.  Hormone replacement therapy may reduce some of these symptoms and risks. Talk to your health care provider about whether hormone replacement therapy is right for you. Follow these instructions at home:  Schedule regular health, dental, and eye exams.  Stay current with your immunizations.  Do not use any tobacco products including cigarettes, chewing tobacco, or electronic cigarettes.  If you are pregnant, do not drink alcohol.  If you are breastfeeding, limit how much and how often you drink alcohol.  Limit alcohol intake to no more than 1 drink per day for nonpregnant women. One drink equals 12 ounces of beer, 5 ounces of wine, or 1 ounces of hard liquor.  Do not use street drugs.  Do not share needles.  Ask your health care provider for help if you need support or information about quitting drugs.  Tell your health care provider if you often feel depressed.  Tell your health care provider if you have ever been abused or do not feel safe at home. This information is not intended to replace advice given to you by your health care provider. Make sure you discuss any questions you have with your health care provider. Document Released: 09/02/2010 Document Revised: 07/26/2015 Document Reviewed: 11/21/2014 Elsevier Interactive Patient Education  2018 [place].  M.D.

## 2017-09-08 NOTE — Patient Instructions (Signed)
Last pap 2017  Due in 5 years or less   Will notify you  of labs when available.   Continue lifestyle intervention healthy eating and exercise . Get enough calcium vit d in  Diet and . Continue heart healthy  Habits    Health Maintenance, Female Adopting a healthy lifestyle and getting preventive care can go a long way to promote health and wellness. Talk with your health care provider about what schedule of regular examinations is right for you. This is a good chance for you to check in with your provider about disease prevention and staying healthy. In between checkups, there are plenty of things you can do on your own. Experts have done a lot of research about which lifestyle changes and preventive measures are most likely to keep you healthy. Ask your health care provider for more information. Weight and diet Eat a healthy diet  Be sure to include plenty of vegetables, fruits, low-fat dairy products, and lean protein.  Do not eat a lot of foods high in solid fats, added sugars, or salt.  Get regular exercise. This is one of the most important things you can do for your health. ? Most adults should exercise for at least 150 minutes each week. The exercise should increase your heart rate and make you sweat (moderate-intensity exercise). ? Most adults should also do strengthening exercises at least twice a week. This is in addition to the moderate-intensity exercise.  Maintain a healthy weight  Body mass index (BMI) is a measurement that can be used to identify possible weight problems. It estimates body fat based on height and weight. Your health care provider can help determine your BMI and help you achieve or maintain a healthy weight.  For females 70 years of age and older: ? A BMI below 18.5 is considered underweight. ? A BMI of 18.5 to 24.9 is normal. ? A BMI of 25 to 29.9 is considered overweight. ? A BMI of 30 and above is considered obese.  Watch levels of cholesterol and  blood lipids  You should start having your blood tested for lipids and cholesterol at 60 years of age, then have this test every 5 years.  You may need to have your cholesterol levels checked more often if: ? Your lipid or cholesterol levels are high. ? You are older than 60 years of age. ? You are at high risk for heart disease.  Cancer screening Lung Cancer  Lung cancer screening is recommended for adults 10-36 years old who are at high risk for lung cancer because of a history of smoking.  A yearly low-dose CT scan of the lungs is recommended for people who: ? Currently smoke. ? Have quit within the past 15 years. ? Have at least a 30-pack-year history of smoking. A pack year is smoking an average of one pack of cigarettes a day for 1 year.  Yearly screening should continue until it has been 15 years since you quit.  Yearly screening should stop if you develop a health problem that would prevent you from having lung cancer treatment.  Breast Cancer  Practice breast self-awareness. This means understanding how your breasts normally appear and feel.  It also means doing regular breast self-exams. Let your health care provider know about any changes, no matter how small.  If you are in your 20s or 30s, you should have a clinical breast exam (CBE) by a health care provider every 1-3 years as part of a regular health  exam.  If you are 40 or older, have a CBE every year. Also consider having a breast X-ray (mammogram) every year.  If you have a family history of breast cancer, talk to your health care provider about genetic screening.  If you are at high risk for breast cancer, talk to your health care provider about having an MRI and a mammogram every year.  Breast cancer gene (BRCA) assessment is recommended for women who have family members with BRCA-related cancers. BRCA-related cancers include: ? Breast. ? Ovarian. ? Tubal. ? Peritoneal cancers.  Results of the assessment  will determine the need for genetic counseling and BRCA1 and BRCA2 testing.  Cervical Cancer Your health care provider may recommend that you be screened regularly for cancer of the pelvic organs (ovaries, uterus, and vagina). This screening involves a pelvic examination, including checking for microscopic changes to the surface of your cervix (Pap test). You may be encouraged to have this screening done every 3 years, beginning at age 31.  For women ages 82-65, health care providers may recommend pelvic exams and Pap testing every 3 years, or they may recommend the Pap and pelvic exam, combined with testing for human papilloma virus (HPV), every 5 years. Some types of HPV increase your risk of cervical cancer. Testing for HPV may also be done on women of any age with unclear Pap test results.  Other health care providers may not recommend any screening for nonpregnant women who are considered low risk for pelvic cancer and who do not have symptoms. Ask your health care provider if a screening pelvic exam is right for you.  If you have had past treatment for cervical cancer or a condition that could lead to cancer, you need Pap tests and screening for cancer for at least 20 years after your treatment. If Pap tests have been discontinued, your risk factors (such as having a new sexual partner) need to be reassessed to determine if screening should resume. Some women have medical problems that increase the chance of getting cervical cancer. In these cases, your health care provider may recommend more frequent screening and Pap tests.  Colorectal Cancer  This type of cancer can be detected and often prevented.  Routine colorectal cancer screening usually begins at 60 years of age and continues through 60 years of age.  Your health care provider may recommend screening at an earlier age if you have risk factors for colon cancer.  Your health care provider may also recommend using home test kits to  check for hidden blood in the stool.  A small camera at the end of a tube can be used to examine your colon directly (sigmoidoscopy or colonoscopy). This is done to check for the earliest forms of colorectal cancer.  Routine screening usually begins at age 53.  Direct examination of the colon should be repeated every 5-10 years through 60 years of age. However, you may need to be screened more often if early forms of precancerous polyps or small growths are found.  Skin Cancer  Check your skin from head to toe regularly.  Tell your health care provider about any new moles or changes in moles, especially if there is a change in a mole's shape or color.  Also tell your health care provider if you have a mole that is larger than the size of a pencil eraser.  Always use sunscreen. Apply sunscreen liberally and repeatedly throughout the day.  Protect yourself by wearing long sleeves, pants, a wide-brimmed  hat, and sunglasses whenever you are outside.  Heart disease, diabetes, and high blood pressure  High blood pressure causes heart disease and increases the risk of stroke. High blood pressure is more likely to develop in: ? People who have blood pressure in the high end of the normal range (130-139/85-89 mm Hg). ? People who are overweight or obese. ? People who are African American.  If you are 68-81 years of age, have your blood pressure checked every 3-5 years. If you are 21 years of age or older, have your blood pressure checked every year. You should have your blood pressure measured twice-once when you are at a hospital or clinic, and once when you are not at a hospital or clinic. Record the average of the two measurements. To check your blood pressure when you are not at a hospital or clinic, you can use: ? An automated blood pressure machine at a pharmacy. ? A home blood pressure monitor.  If you are between 26 years and 65 years old, ask your health care provider if you should  take aspirin to prevent strokes.  Have regular diabetes screenings. This involves taking a blood sample to check your fasting blood sugar level. ? If you are at a normal weight and have a low risk for diabetes, have this test once every three years after 60 years of age. ? If you are overweight and have a high risk for diabetes, consider being tested at a younger age or more often. Preventing infection Hepatitis B  If you have a higher risk for hepatitis B, you should be screened for this virus. You are considered at high risk for hepatitis B if: ? You were born in a country where hepatitis B is common. Ask your health care provider which countries are considered high risk. ? Your parents were born in a high-risk country, and you have not been immunized against hepatitis B (hepatitis B vaccine). ? You have HIV or AIDS. ? You use needles to inject street drugs. ? You live with someone who has hepatitis B. ? You have had sex with someone who has hepatitis B. ? You get hemodialysis treatment. ? You take certain medicines for conditions, including cancer, organ transplantation, and autoimmune conditions.  Hepatitis C  Blood testing is recommended for: ? Everyone born from [birth year] through 1963. ? Anyone with known risk factors for hepatitis C.  Sexually transmitted infections (STIs)  You should be screened for sexually transmitted infections (STIs) including gonorrhea and chlamydia if: ? You are sexually active and are younger than 60 years of age. ? You are older than 60 years of age and your health care provider tells you that you are at risk for this type of infection. ? Your sexual activity has changed since you were last screened and you are at an increased risk for chlamydia or gonorrhea. Ask your health care provider if you are at risk.  If you do not have HIV, but are at risk, it may be recommended that you take a prescription medicine daily to prevent HIV infection. This is called  pre-exposure prophylaxis (PrEP). You are considered at risk if: ? You are sexually active and do not regularly use condoms or know the HIV status of your partner(s). ? You take drugs by injection. ? You are sexually active with a partner who has HIV.  Talk with your health care provider about whether you are at high risk of being infected with HIV. If you choose to begin  PrEP, you should first be tested for HIV. You should then be tested every 3 months for as long as you are taking PrEP. Pregnancy  If you are premenopausal and you may become pregnant, ask your health care provider about preconception counseling.  If you may become pregnant, take 400 to 800 micrograms (mcg) of folic acid every day.  If you want to prevent pregnancy, talk to your health care provider about birth control (contraception). Osteoporosis and menopause  Osteoporosis is a disease in which the bones lose minerals and strength with aging. This can result in serious bone fractures. Your risk for osteoporosis can be identified using a bone density scan.  If you are 8 years of age or older, or if you are at risk for osteoporosis and fractures, ask your health care provider if you should be screened.  Ask your health care provider whether you should take a calcium or vitamin D supplement to lower your risk for osteoporosis.  Menopause may have certain physical symptoms and risks.  Hormone replacement therapy may reduce some of these symptoms and risks. Talk to your health care provider about whether hormone replacement therapy is right for you. Follow these instructions at home:  Schedule regular health, dental, and eye exams.  Stay current with your immunizations.  Do not use any tobacco products including cigarettes, chewing tobacco, or electronic cigarettes.  If you are pregnant, do not drink alcohol.  If you are breastfeeding, limit how much and how often you drink alcohol.  Limit alcohol intake to no more  than 1 drink per day for nonpregnant women. One drink equals 12 ounces of beer, 5 ounces of wine, or 1 ounces of hard liquor.  Do not use street drugs.  Do not share needles.  Ask your health care provider for help if you need support or information about quitting drugs.  Tell your health care provider if you often feel depressed.  Tell your health care provider if you have ever been abused or do not feel safe at home. This information is not intended to replace advice given to you by your health care provider. Make sure you discuss any questions you have with your health care provider. Document Released: 09/02/2010 Document Revised: 07/26/2015 Document Reviewed: 11/21/2014 [employer] Interactive Patient Education  [employer].

## 2017-09-09 LAB — HEPATITIS C ANTIBODY
Hepatitis C Ab: NONREACTIVE
SIGNAL TO CUT-OFF: 0.01 (ref <1.00)

## 2019-01-20 ENCOUNTER — Other Ambulatory Visit: Payer: Self-pay

## 2019-01-20 DIAGNOSIS — Z20822 Contact with and (suspected) exposure to covid-19: Secondary | ICD-10-CM

## 2019-01-22 LAB — NOVEL CORONAVIRUS, NAA: SARS-CoV-2, NAA: NOT DETECTED

## 2019-05-25 ENCOUNTER — Other Ambulatory Visit: Payer: Self-pay | Admitting: Internal Medicine

## 2019-05-25 DIAGNOSIS — Z1231 Encounter for screening mammogram for malignant neoplasm of breast: Secondary | ICD-10-CM

## 2019-06-08 ENCOUNTER — Ambulatory Visit
Admission: RE | Admit: 2019-06-08 | Discharge: 2019-06-08 | Disposition: A | Discharge status: Home / Self Care | Payer: BC Managed Care – PPO | Source: Ambulatory Visit | Attending: Internal Medicine | Admitting: Internal Medicine

## 2019-06-08 ENCOUNTER — Other Ambulatory Visit: Payer: Self-pay

## 2019-06-08 DIAGNOSIS — Z1231 Encounter for screening mammogram for malignant neoplasm of breast: Secondary | ICD-10-CM

## 2019-09-13 NOTE — Progress Notes (Signed)
Chief Complaint  Patient presents with  . Annual Exam    Doing well    HPI: Patient  Emma Day  64 y.o. comes in today for Preventive Health Care visit  No new co  Due for pap? No sx menopause  In 2016 no sx  Health Maintenance  Topic Date Due  . HIV Screening  Never done  . PAP SMEAR-Modifier  09/05/2018  . INFLUENZA VACCINE  10/02/2019  . COLONOSCOPY  11/11/2020  . MAMMOGRAM  06/07/2021  . TETANUS/TDAP  09/04/2025  . COVID-19 Vaccine  Completed  . Hepatitis C Screening  Completed   Health Maintenance Review LIFESTYLE:  Exercise:   A lot  Walk 3 miles per day  Kayaking biking etc etc .  Tobacco/ETS:n Alcohol:  ocass  5 per week  Sugar beverages: no Sleep: +  Average 7  Drug use: no HH of   2   Lost all pets  Work: maintaining houses and  [employer]   Work maintenance    Last pap 7 2017 neg hpv   ROS:  Ringing in ears   No lo hearing  Left .  GEN/ HEENT: No fever, significant weight changes sweats headaches vision problems hearing changes, CV/ PULM; No chest pain shortness of breath cough, syncope,edema  change in exercise tolerance. GI /GU: No adominal pain, vomiting, change in bowel habits. No blood in the stool. No significant GU symptoms. SKIN/HEME: ,no acute skin rashes suspicious lesions or bleeding. No lymphadenopathy, nodules, masses.  NEURO/ PSYCH:  No neurologic signs such as weakness numbness. No depression anxiety. IMM/ Allergy: No unusual infections.  Allergy .   REST of 12 system review negative except as per HPI   Past Medical History:  Diagnosis Date  . History of ovarian cyst   . Syncope     Past Surgical History:  Procedure Laterality Date  . BREAST BIOPSY Left   . HERNIA REPAIR  04/29/2007   inguinal rt     Family History  Problem Relation Age of Onset  . Colon polyps Father        pt's father was in his 76's. lg polyp removed surgically and part of colon was removed.  . Coronary artery disease Other   . Parkinsonism Other   . Other  Other        low hdl  . Breast cancer Maternal Grandmother   . Colon cancer Neg Hx   . Esophageal cancer Neg Hx   . Rectal cancer Neg Hx   . Stomach cancer Neg Hx     Social History   Socioeconomic History  . Marital status: Married    Spouse name: Not on file  . Number of children: Not on file  . Years of education: Not on file  . Highest education level: Not on file  Occupational History  . Not on file  Tobacco Use  . Smoking status: Never Smoker  . Smokeless tobacco: Never Used  Vaping Use  . Vaping Use: Never used  Substance and Sexual Activity  . Alcohol use: Yes    Alcohol/week: 7.0 standard drinks    Types: 7 Standard drinks or equivalent per week    Comment: socially  . Drug use: No  . Sexual activity: Not on file  Other Topics Concern  . Not on file  Social History Narrative   Married   G2P2   HH of  2   After august.   Son to [employer]    Pet  2 dogs and 1 horse.                Social Determinants of Health   Financial Resource Strain:   . Difficulty of Paying Living Expenses:   Food Insecurity:   . Worried About Charity fundraiser in the Last Year:   . Arboriculturist in the Last Year:   Transportation Needs:   . Film/video editor (Medical):   Marland Kitchen Lack of Transportation (Non-Medical):   Physical Activity:   . Days of Exercise per Week:   . Minutes of Exercise per Session:   Stress:   . Feeling of Stress :   Social Connections:   . Frequency of Communication with Friends and Family:   . Frequency of Social Gatherings with Friends and Family:   . Attends Religious Services:   . Active Member of Clubs or Organizations:   . Attends Archivist Meetings:   Marland Kitchen Marital Status:     No outpatient medications prior to visit.   Facility-Administered Medications Prior to Visit  Medication Dose Route Frequency Provider Last Rate Last Admin  . 0.9 %  sodium chloride infusion  500 mL Intravenous Continuous Ladene Artist, MD          EXAM:  BP 118/68   Pulse 70   Temp 98.4 F (36.9 C) (Oral)   Ht 5' 4.5" (1.638 m)   Wt 137 lb (62.1 kg)   LMP 12/02/2014   SpO2 94%   BMI 23.15 kg/m   Body mass index is 23.15 kg/m. Wt Readings from Last 3 Encounters:  09/14/19 137 lb (62.1 kg)  09/08/17 131 lb (59.4 kg)  11/12/15 136 lb (61.7 kg)    Physical Exam: Vital signs reviewed YJE:HUDJ is a well-developed well-nourished alert cooperative    who appearsr stated age in no acute distress.  HEENT: normocephalic atraumatic , Eyes: PERRL EOM's full, conjunctiva clear, Nares: paten,t no deformity discharge or tenderness., Ears: no deformity EAC's clear TMs with normal landmarks. Mouth:masked NECK: supple without masses, thyromegaly or bruits. CHEST/PULM:  Clear to auscultation and percussion breath sounds equal no wheeze , rales or rhonchi. No chest wall deformities or tenderness. Breast: normal by inspection . No dimpling, discharge, masses, tenderness or discharge . CV: PMI is nondisplaced, S1 S2 no gallops, murmurs, rubs. Peripheral pulses are full without delay.No JVD .  ABDOMEN: Bowel sounds normal nontender  No guard or rebound, no hepato splenomegal no CVA tenderness.  No hernia. Extremtities:  No clubbing cyanosis or edema, no acute joint swelling or redness no focal atrophy NEURO:  Oriented x3, cranial nerves 3-12 appear to be intact, no obvious focal weakness,gait within normal limits no abnormal reflexes or asymmetrical SKIN: No acute rashes normal turgor, color, no bruising or petechiae. PSYCH: Oriented, good eye contact, no obvious depression anxiety, cognition and judgment appear normal. LN: no cervical axillary inguinal adenopathy Pelvic: NL ext GU, labia clear without lesions or rash . Vagina no lesions .Cervix: clear  UTERUS: Neg CMT Adnexa:  clear no masses . PAP done w hi risk hpv   Lab Results  Component Value Date   WBC 4.9 09/08/2017   HGB 14.4 09/08/2017   HCT 42.8 09/08/2017   PLT 206.0  09/08/2017   GLUCOSE 97 09/08/2017   CHOL 206 (H) 09/08/2017   TRIG 98.0 09/08/2017   HDL 72.60 09/08/2017   LDLDIRECT 123.5 05/26/2011   LDLCALC 114 (H) 09/08/2017   ALT 12 09/08/2017   AST 15  09/08/2017   NA 142 09/08/2017   K 4.0 09/08/2017   CL 104 09/08/2017   CREATININE 0.64 09/08/2017   BUN 11 09/08/2017   CO2 30 09/08/2017   TSH 2.56 09/08/2017    BP Readings from Last 3 Encounters:  09/14/19 118/68  09/08/17 113/75  11/12/15 106/64    Lab plan  reviewed with patient   ASSESSMENT AND PLAN:  Discussed the following assessment and plan:    ICD-10-CM   1. Visit for preventive health examination  R60.45 Basic metabolic panel    CBC with Differential/Platelet    Hepatic function panel    Lipid panel    TSH  2. Encounter for gynecological examination without abnormal finding  W09.811 Basic metabolic panel    CBC with Differential/Platelet    Hepatic function panel    Lipid panel    TSH  3. Screening, lipid  B14.782 Basic metabolic panel    CBC with Differential/Platelet    Hepatic function panel    Lipid panel    TSH  4. Pap smear for cervical cancer screening  Z12.4 PAP [[facility]]  nl exam ringing  some one ear no lesion dis further eval if  persistent or progressive but ear protection and no other alarm sx  Bone health continue exercise  utd on covid vaccine Return in about 1 year (around 09/13/2020) for preventive /cpx  or as needed .  Patient Care Team: Meeghan Skipper, Standley Brooking, MD as PCP - General Arvella Nigh, MD (Obstetrics and Gynecology) Patient Instructions     Preventive Care 37-77 Years Old, Female Preventive care refers to visits with your health care provider and lifestyle choices that can promote health and wellness. This includes:  A yearly physical exam. This may also be called an annual well check.  Regular dental visits and eye exams.  Immunizations.  Screening for certain conditions.  Healthy lifestyle choices, such as eating a  healthy diet, getting regular exercise, not using drugs or products that contain nicotine and tobacco, and limiting alcohol use. What can I expect for my preventive care visit? Physical exam Your health care provider will check your:  Height and weight. This may be used to calculate body mass index (BMI), which tells if you are at a healthy weight.  Heart rate and blood pressure.  Skin for abnormal spots. Counseling Your health care provider may ask you questions about your:  Alcohol, tobacco, and drug use.  Emotional well-being.  Home and relationship well-being.  Sexual activity.  Eating habits.  Work and work Statistician.  Method of birth control.  Menstrual cycle.  Pregnancy history. What immunizations do I need?  Influenza (flu) vaccine  This is recommended every year. Tetanus, diphtheria, and pertussis (Tdap) vaccine  You may need a Td booster every 10 years. Varicella (chickenpox) vaccine  You may need this if you have not been vaccinated. Zoster (shingles) vaccine  You may need this after age 89. Measles, mumps, and rubella (MMR) vaccine  You may need at least one dose of MMR if you were born in 1957 or later. You may also need a second dose. Pneumococcal conjugate (PCV13) vaccine  You may need this if you have certain conditions and were not previously vaccinated. Pneumococcal polysaccharide (PPSV23) vaccine  You may need one or two doses if you smoke cigarettes or if you have certain conditions. Meningococcal conjugate (MenACWY) vaccine  You may need this if you have certain conditions. Hepatitis A vaccine  You may need this if you  have certain conditions or if you travel or work in places where you may be exposed to hepatitis A. Hepatitis B vaccine  You may need this if you have certain conditions or if you travel or work in places where you may be exposed to hepatitis B. Haemophilus influenzae type b (Hib) vaccine  You may need this if you  have certain conditions. Human papillomavirus (HPV) vaccine  If recommended by your health care provider, you may need three doses over 6 months. You may receive vaccines as individual doses or as more than one vaccine together in one shot (combination vaccines). Talk with your health care provider about the risks and benefits of combination vaccines. What tests do I need? Blood tests  Lipid and cholesterol levels. These may be checked every 5 years, or more frequently if you are over 51 years old.  Hepatitis C test.  Hepatitis B test. Screening  Lung cancer screening. You may have this screening every year starting at age 44 if you have a 30-pack-year history of smoking and currently smoke or have quit within the past 15 years.  Colorectal cancer screening. All adults should have this screening starting at age 63 and continuing until age 69. Your health care provider may recommend screening at age 7 if you are at increased risk. You will have tests every 1-10 years, depending on your results and the type of screening test.  Diabetes screening. This is done by checking your blood sugar (glucose) after you have not eaten for a while (fasting). You may have this done every 1-3 years.  Mammogram. This may be done every 1-2 years. Talk with your health care provider about when you should start having regular mammograms. This may depend on whether you have a family history of breast cancer.  BRCA-related cancer screening. This may be done if you have a family history of breast, ovarian, tubal, or peritoneal cancers.  Pelvic exam and Pap test. This may be done every 3 years starting at age 85. Starting at age 55, this may be done every 5 years if you have a Pap test in combination with an HPV test. Other tests  Sexually transmitted disease (STD) testing.  Bone density scan. This is done to screen for osteoporosis. You may have this scan if you are at high risk for osteoporosis. Follow these  instructions at home: Eating and drinking  Eat a diet that includes fresh fruits and vegetables, whole grains, lean protein, and low-fat dairy.  Take vitamin and mineral supplements as recommended by your health care provider.  Do not drink alcohol if: ? Your health care provider tells you not to drink. ? You are pregnant, may be pregnant, or are planning to become pregnant.  If you drink alcohol: ? Limit how much you have to 0-1 drink a day. ? Be aware of how much alcohol is in your drink. In the [place] one drink equals one 12 oz bottle of beer (355 mL), one 5 oz glass of wine (148 mL), or one 1 oz glass of hard liquor (44 mL). Lifestyle  Take daily care of your teeth and gums.  Stay active. Exercise for at least 30 minutes on 5 or more days each week.  Do not use any products that contain nicotine or tobacco, such as cigarettes, e-cigarettes, and chewing tobacco. If you need help quitting, ask your health care provider.  If you are sexually active, practice safe sex. Use a condom or other form of birth control (contraception)  in order to prevent pregnancy and STIs (sexually transmitted infections).  If told by your health care provider, take low-dose aspirin daily starting at age 39. What's next?  Visit your health care provider once a year for a well check visit.  Ask your health care provider how often you should have your eyes and teeth checked.  Stay up to date on all vaccines. This information is not intended to replace advice given to you by your health care provider. Make sure you discuss any questions you have with your health care provider. Document Revised: 10/29/2017 Document Reviewed: 10/29/2017 Elsevier Patient Education  [place] protect organs, store calcium, anchor muscles, and support the whole body. Keeping your bones strong is important, especially as you get older. You can take actions to help keep your bones strong and  healthy. Why is keeping my bones healthy important?  Keeping your bones healthy is important because your body constantly replaces bone cells. Cells get old, and new cells take their place. As we age, we lose bone cells because the body may not be able to make enough new cells to replace the old cells. The amount of bone cells and bone tissue you have is referred to as bone mass. The higher your bone mass, the stronger your bones. The aging process leads to an overall loss of bone mass in the body, which can increase the likelihood of:  Joint pain and stiffness.  Broken bones.  A condition in which the bones become weak and brittle (osteoporosis). A large decline in bone mass occurs in older adults. In women, it occurs about the time of menopause. What actions can I take to keep my bones healthy? Good health habits are important for maintaining healthy bones. This includes eating nutritious foods and exercising regularly. To have healthy bones, you need to get enough of the right minerals and vitamins. Most nutrition experts recommend getting these nutrients from the foods that you eat. In some cases, taking supplements may also be recommended. Doing certain types of exercise is also important for bone health. What are the nutritional recommendations for healthy bones?  Eating a well-balanced diet with plenty of calcium and vitamin D will help to protect your bones. Nutritional recommendations vary from person to person. Ask your health care provider what is healthy for you. Here are some general guidelines. Get enough calcium Calcium is the most important (essential) mineral for bone health. Most people can get enough calcium from their diet, but supplements may be recommended for people who are at risk for osteoporosis. Good sources of calcium include:  Dairy products, such as low-fat or nonfat milk, cheese, and yogurt.  Dark green leafy vegetables, such as bok choy and  broccoli.  Calcium-fortified foods, such as orange juice, cereal, bread, soy beverages, and tofu products.  Nuts, such as almonds. Follow these recommended amounts for daily calcium intake:  Children, age 92-3: 700 mg.  Children, age 63-8: 1,000 mg.  Children, age 75-13: 1,300 mg.  Teens, age 72-18: 1,300 mg.  Adults, age 638-50: 1,000 mg.  Adults, age 632-70: ? Men: 1,000 mg. ? Women: 1,200 mg.  Adults, age 50 or older: 1,200 mg.  Pregnant and breastfeeding females: ? Teens: 1,300 mg. ? Adults: 1,000 mg. Get enough vitamin D Vitamin D is the most essential vitamin for bone health. It helps the body absorb calcium. Sunlight stimulates the skin to make vitamin D, so be sure to get enough sunlight. If  you live in a cold climate or you do not get outside often, your health care provider may recommend that you take vitamin D supplements. Good sources of vitamin D in your diet include:  Egg yolks.  Saltwater fish.  Milk and cereal fortified with vitamin D. Follow these recommended amounts for daily vitamin D intake:  Children and teens, age 69-18: 600 international units.  Adults, age 43 or younger: 400-800 international units.  Adults, age 29 or older: 800-1,000 international units. Get other important nutrients Other nutrients that are important for bone health include:  Phosphorus. This mineral is found in meat, poultry, dairy foods, nuts, and legumes. The recommended daily intake for adult men and adult women is 700 mg.  Magnesium. This mineral is found in seeds, nuts, dark green vegetables, and legumes. The recommended daily intake for adult men is 400-420 mg. For adult women, it is 310-320 mg.  Vitamin K. This vitamin is found in green leafy vegetables. The recommended daily intake is 120 mg for adult men and 90 mg for adult women. What type of physical activity is best for building and maintaining healthy bones? Weight-bearing and strength-building activities are  important for building and maintaining healthy bones. Weight-bearing activities cause muscles and bones to work against gravity. Strength-building activities increase the strength of the muscles that support bones. Weight-bearing and muscle-building activities include:  Walking and hiking.  Jogging and running.  Dancing.  Gym exercises.  Lifting weights.  Tennis and racquetball.  Climbing stairs.  Aerobics. Adults should get at least 30 minutes of moderate physical activity on most days. Children should get at least 60 minutes of moderate physical activity on most days. Ask your health care provider what type of exercise is best for you. How can I find out if my bone mass is low? Bone mass can be measured with an X-ray test called a bone mineral density (BMD) test. This test is recommended for all women who are age 39 or older. It may also be recommended for:  Men who are age 42 or older.  People who are at risk for osteoporosis because of: ? Having bones that break easily. ? Having a long-term disease that weakens bones, such as kidney disease or rheumatoid arthritis. ? Having menopause earlier than normal. ? Taking medicine that weakens bones, such as steroids, thyroid hormones, or hormone treatment for breast cancer or prostate cancer. ? Smoking. ? Drinking three or more alcoholic drinks a day. If you find that you have a low bone mass, you may be able to prevent osteoporosis or further bone loss by changing your diet and lifestyle. Where can I find more information? For more information, check out the following websites:  [place]: AviationTales.fr  [employer] of Health: www.bones.SouthExposed.es  International Osteoporosis Foundation: Administrator.iofbonehealth.org Summary  The aging process leads to an overall loss of bone mass in the body, which can increase the likelihood of broken bones and osteoporosis.  Eating a well-balanced diet with plenty  of calcium and vitamin D will help to protect your bones.  Weight-bearing and strength-building activities are also important for building and maintaining strong bones.  Bone mass can be measured with an X-ray test called a bone mineral density (BMD) test. This information is not intended to replace advice given to you by your health care provider. Make sure you discuss any questions you have with your health care provider. Document Revised: 03/16/2017 Document Reviewed: 03/16/2017 Elsevier Patient Education  2020 [employer].  Standley Brooking. Vitaly Wanat M.D.

## 2019-09-14 ENCOUNTER — Ambulatory Visit (INDEPENDENT_AMBULATORY_CARE_PROVIDER_SITE_OTHER): Payer: BC Managed Care – PPO | Admitting: Internal Medicine

## 2019-09-14 ENCOUNTER — Other Ambulatory Visit: Payer: Self-pay

## 2019-09-14 ENCOUNTER — Other Ambulatory Visit (HOSPITAL_COMMUNITY)
Admission: RE | Admit: 2019-09-14 | Discharge: 2019-09-14 | Disposition: A | Discharge status: Home / Self Care | Payer: BC Managed Care – PPO | Source: Ambulatory Visit | Attending: Internal Medicine | Admitting: Internal Medicine

## 2019-09-14 ENCOUNTER — Encounter: Payer: Self-pay | Admitting: Internal Medicine

## 2019-09-14 VITALS — BP 118/68 | HR 70 | Temp 98.4°F | Ht 64.5 in | Wt 137.0 lb

## 2019-09-14 DIAGNOSIS — Z01419 Encounter for gynecological examination (general) (routine) without abnormal findings: Secondary | ICD-10-CM | POA: Diagnosis not present

## 2019-09-14 DIAGNOSIS — Z0000 Encounter for general adult medical examination without abnormal findings: Secondary | ICD-10-CM | POA: Diagnosis not present

## 2019-09-14 DIAGNOSIS — Z124 Encounter for screening for malignant neoplasm of cervix: Secondary | ICD-10-CM | POA: Diagnosis not present

## 2019-09-14 DIAGNOSIS — Z13220 Encounter for screening for lipoid disorders: Secondary | ICD-10-CM

## 2019-09-14 NOTE — Patient Instructions (Addendum)
Preventive Care 40-62 Years Old, Female Preventive care refers to visits with your health care provider and lifestyle choices that can promote health and wellness. This includes:  A yearly physical exam. This may also be called an annual well check.  Regular dental visits and eye exams.  Immunizations.  Screening for certain conditions.  Healthy lifestyle choices, such as eating a healthy diet, getting regular exercise, not using drugs or products that contain nicotine and tobacco, and limiting alcohol use. What can I expect for my preventive care visit? Physical exam Your health care provider will check your:  Height and weight. This may be used to calculate body mass index (BMI), which tells if you are at a healthy weight.  Heart rate and blood pressure.  Skin for abnormal spots. Counseling Your health care provider may ask you questions about your:  Alcohol, tobacco, and drug use.  Emotional well-being.  Home and relationship well-being.  Sexual activity.  Eating habits.  Work and work environment.  Method of birth control.  Menstrual cycle.  Pregnancy history. What immunizations do I need?  Influenza (flu) vaccine  This is recommended every year. Tetanus, diphtheria, and pertussis (Tdap) vaccine  You may need a Td booster every 10 years. Varicella (chickenpox) vaccine  You may need this if you have not been vaccinated. Zoster (shingles) vaccine  You may need this after age 60. Measles, mumps, and rubella (MMR) vaccine  You may need at least one dose of MMR if you were born in 1957 or later. You may also need a second dose. Pneumococcal conjugate (PCV13) vaccine  You may need this if you have certain conditions and were not previously vaccinated. Pneumococcal polysaccharide (PPSV23) vaccine  You may need one or two doses if you smoke cigarettes or if you have certain conditions. Meningococcal conjugate (MenACWY) vaccine  You may need this if you  have certain conditions. Hepatitis A vaccine  You may need this if you have certain conditions or if you travel or work in places where you may be exposed to hepatitis A. Hepatitis B vaccine  You may need this if you have certain conditions or if you travel or work in places where you may be exposed to hepatitis B. Haemophilus influenzae type b (Hib) vaccine  You may need this if you have certain conditions. Human papillomavirus (HPV) vaccine  If recommended by your health care provider, you may need three doses over 6 months. You may receive vaccines as individual doses or as more than one vaccine together in one shot (combination vaccines). Talk with your health care provider about the risks and benefits of combination vaccines. What tests do I need? Blood tests  Lipid and cholesterol levels. These may be checked every 5 years, or more frequently if you are over 50 years old.  Hepatitis C test.  Hepatitis B test. Screening  Lung cancer screening. You may have this screening every year starting at age 55 if you have a 30-pack-year history of smoking and currently smoke or have quit within the past 15 years.  Colorectal cancer screening. All adults should have this screening starting at age 50 and continuing until age 75. Your health care provider may recommend screening at age 45 if you are at increased risk. You will have tests every 1-10 years, depending on your results and the type of screening test.  Diabetes screening. This is done by checking your blood sugar (glucose) after you have not eaten for a while (fasting). You may have this   have this done every 1-3 years.  Mammogram. This may be done every 1-2 years. Talk with your health care provider about when you should start having regular mammograms. This may depend on whether you have a family history of breast cancer.  BRCA-related cancer screening. This may be done if you have a family history of breast, ovarian, tubal, or  peritoneal cancers.  Pelvic exam and Pap test. This may be done every 3 years starting at age 9. Starting at age 72, this may be done every 5 years if you have a Pap test in combination with an HPV test. Other tests  Sexually transmitted disease (STD) testing.  Bone density scan. This is done to screen for osteoporosis. You may have this scan if you are at high risk for osteoporosis. Follow these instructions at home: Eating and drinking  Eat a diet that includes fresh fruits and vegetables, whole grains, lean protein, and low-fat dairy.  Take vitamin and mineral supplements as recommended by your health care provider.  Do not drink alcohol if: ? Your health care provider tells you not to drink. ? You are pregnant, may be pregnant, or are planning to become pregnant.  If you drink alcohol: ? Limit how much you have to 0-1 drink a day. ? Be aware of how much alcohol is in your drink. In the [place] one drink equals one 12 oz bottle of beer (355 mL), one 5 oz glass of wine (148 mL), or one 1 oz glass of hard liquor (44 mL). Lifestyle  Take daily care of your teeth and gums.  Stay active. Exercise for at least 30 minutes on 5 or more days each week.  Do not use any products that contain nicotine or tobacco, such as cigarettes, e-cigarettes, and chewing tobacco. If you need help quitting, ask your health care provider.  If you are sexually active, practice safe sex. Use a condom or other form of birth control (contraception) in order to prevent pregnancy and STIs (sexually transmitted infections).  If told by your health care provider, take low-dose aspirin daily starting at age 26. What's next?  Visit your health care provider once a year for a well check visit.  Ask your health care provider how often you should have your eyes and teeth checked.  Stay up to date on all vaccines. This information is not intended to replace advice given to you by your health care provider. Make  sure you discuss any questions you have with your health care provider. Document Revised: 10/29/2017 Document Reviewed: 10/29/2017 Elsevier Patient Education  [place] protect organs, store calcium, anchor muscles, and support the whole body. Keeping your bones strong is important, especially as you get older. You can take actions to help keep your bones strong and healthy. Why is keeping my bones healthy important?  Keeping your bones healthy is important because your body constantly replaces bone cells. Cells get old, and new cells take their place. As we age, we lose bone cells because the body may not be able to make enough new cells to replace the old cells. The amount of bone cells and bone tissue you have is referred to as bone mass. The higher your bone mass, the stronger your bones. The aging process leads to an overall loss of bone mass in the body, which can increase the likelihood of:  Joint pain and stiffness.  Broken bones.  A condition in which the bones become weak  and brittle (osteoporosis). A large decline in bone mass occurs in older adults. In women, it occurs about the time of menopause. What actions can I take to keep my bones healthy? Good health habits are important for maintaining healthy bones. This includes eating nutritious foods and exercising regularly. To have healthy bones, you need to get enough of the right minerals and vitamins. Most nutrition experts recommend getting these nutrients from the foods that you eat. In some cases, taking supplements may also be recommended. Doing certain types of exercise is also important for bone health. What are the nutritional recommendations for healthy bones?  Eating a well-balanced diet with plenty of calcium and vitamin D will help to protect your bones. Nutritional recommendations vary from person to person. Ask your health care provider what is healthy for you. Here are some general  guidelines. Get enough calcium Calcium is the most important (essential) mineral for bone health. Most people can get enough calcium from their diet, but supplements may be recommended for people who are at risk for osteoporosis. Good sources of calcium include:  Dairy products, such as low-fat or nonfat milk, cheese, and yogurt.  Dark green leafy vegetables, such as bok choy and broccoli.  Calcium-fortified foods, such as orange juice, cereal, bread, soy beverages, and tofu products.  Nuts, such as almonds. Follow these recommended amounts for daily calcium intake:  Children, age 35-3: 700 mg.  Children, age 70-8: 1,000 mg.  Children, age 62-13: 1,300 mg.  Teens, age 28-18: 1,300 mg.  Adults, age 66-50: 1,000 mg.  Adults, age 53-70: ? Men: 1,000 mg. ? Women: 1,200 mg.  Adults, age 5 or older: 1,200 mg.  Pregnant and breastfeeding females: ? Teens: 1,300 mg. ? Adults: 1,000 mg. Get enough vitamin D Vitamin D is the most essential vitamin for bone health. It helps the body absorb calcium. Sunlight stimulates the skin to make vitamin D, so be sure to get enough sunlight. If you live in a cold climate or you do not get outside often, your health care provider may recommend that you take vitamin D supplements. Good sources of vitamin D in your diet include:  Egg yolks.  Saltwater fish.  Milk and cereal fortified with vitamin D. Follow these recommended amounts for daily vitamin D intake:  Children and teens, age 35-18: 600 international units.  Adults, age 350 or younger: 400-800 international units.  Adults, age 704 or older: 800-1,000 international units. Get other important nutrients Other nutrients that are important for bone health include:  Phosphorus. This mineral is found in meat, poultry, dairy foods, nuts, and legumes. The recommended daily intake for adult men and adult women is 700 mg.  Magnesium. This mineral is found in seeds, nuts, dark green vegetables, and  legumes. The recommended daily intake for adult men is 400-420 mg. For adult women, it is 310-320 mg.  Vitamin K. This vitamin is found in green leafy vegetables. The recommended daily intake is 120 mg for adult men and 90 mg for adult women. What type of physical activity is best for building and maintaining healthy bones? Weight-bearing and strength-building activities are important for building and maintaining healthy bones. Weight-bearing activities cause muscles and bones to work against gravity. Strength-building activities increase the strength of the muscles that support bones. Weight-bearing and muscle-building activities include:  Walking and hiking.  Jogging and running.  Dancing.  Gym exercises.  Lifting weights.  Tennis and racquetball.  Climbing stairs.  Aerobics. Adults should get at least 30  minutes of moderate physical activity on most days. Children should get at least 60 minutes of moderate physical activity on most days. Ask your health care provider what type of exercise is best for you. How can I find out if my bone mass is low? Bone mass can be measured with an X-ray test called a bone mineral density (BMD) test. This test is recommended for all women who are age 56 or older. It may also be recommended for:  Men who are age 69 or older.  People who are at risk for osteoporosis because of: ? Having bones that break easily. ? Having a long-term disease that weakens bones, such as kidney disease or rheumatoid arthritis. ? Having menopause earlier than normal. ? Taking medicine that weakens bones, such as steroids, thyroid hormones, or hormone treatment for breast cancer or prostate cancer. ? Smoking. ? Drinking three or more alcoholic drinks a day. If you find that you have a low bone mass, you may be able to prevent osteoporosis or further bone loss by changing your diet and lifestyle. Where can I find more information? For more information, check out the  following websites:  [place]: AviationTales.fr  [employer] of Health: www.bones.SouthExposed.es  International Osteoporosis Foundation: Administrator.iofbonehealth.org Summary  The aging process leads to an overall loss of bone mass in the body, which can increase the likelihood of broken bones and osteoporosis.  Eating a well-balanced diet with plenty of calcium and vitamin D will help to protect your bones.  Weight-bearing and strength-building activities are also important for building and maintaining strong bones.  Bone mass can be measured with an X-ray test called a bone mineral density (BMD) test. This information is not intended to replace advice given to you by your health care provider. Make sure you discuss any questions you have with your health care provider. Document Revised: 03/16/2017 Document Reviewed: 03/16/2017 Elsevier Patient Education  2020 [employer].

## 2019-09-15 ENCOUNTER — Other Ambulatory Visit: Payer: Self-pay

## 2019-09-15 DIAGNOSIS — D696 Thrombocytopenia, unspecified: Secondary | ICD-10-CM

## 2019-09-15 NOTE — Progress Notes (Signed)
Results so far are normal  excepts a  slightly  low platelet count  ( not low enough to cause any bleeding risk usually 50 and below for that )  this may not be important  to your health  and can have many benign causes   but requite followup.    Arrange for cbcdiff and platelets  in  about a month ( dont need to fast)   if any bleeding advise evaluation

## 2019-09-16 LAB — LIPID PANEL
Cholesterol: 186 mg/dL (ref <200)
HDL: 67 mg/dL (ref >=50)
LDL Cholesterol (Calc): 100 mg/dL (calc) — ABNORMAL HIGH
Non-HDL Cholesterol (Calc): 119 mg/dL (calc) (ref <130)
Total CHOL/HDL Ratio: 2.8 (calc) (ref <5.0)
Triglycerides: 94 mg/dL (ref <150)

## 2019-09-16 LAB — CBC WITH DIFFERENTIAL/PLATELET
Absolute Monocytes: 307 cells/uL (ref 200–950)
Basophils Absolute: 32 cells/uL (ref 0–200)
Basophils Relative: 0.6 %
Eosinophils Absolute: 90 cells/uL (ref 15–500)
Eosinophils Relative: 1.7 %
HCT: 42.5 % (ref 35.0–45.0)
Hemoglobin: 13.6 g/dL (ref 11.7–15.5)
Lymphs Abs: 1187 cells/uL (ref 850–3900)
MCH: 28.9 pg (ref 27.0–33.0)
MCHC: 32 g/dL (ref 32.0–36.0)
MCV: 90.2 fL (ref 80.0–100.0)
MPV: 12.3 fL (ref 7.5–12.5)
Monocytes Relative: 5.8 %
Neutro Abs: 3684 cells/uL (ref 1500–7800)
Neutrophils Relative %: 69.5 %
Platelets: 112 10*3/uL — ABNORMAL LOW (ref 140–400)
RBC: 4.71 10*6/uL (ref 3.80–5.10)
RDW: 12.7 % (ref 11.0–15.0)
Total Lymphocyte: 22.4 %
WBC: 5.3 10*3/uL (ref 3.8–10.8)

## 2019-09-16 LAB — BASIC METABOLIC PANEL
BUN: 9 mg/dL (ref 7–25)
CO2: 22 mmol/L (ref 20–32)
Calcium: 9.7 mg/dL (ref 8.6–10.4)
Chloride: 106 mmol/L (ref 98–110)
Creat: 0.66 mg/dL (ref 0.50–0.99)
Glucose, Bld: 90 mg/dL (ref 65–99)
Potassium: 4.5 mmol/L (ref 3.5–5.3)
Sodium: 141 mmol/L (ref 135–146)

## 2019-09-16 LAB — HEPATIC FUNCTION PANEL
AG Ratio: 2.2 (calc) (ref 1.0–2.5)
ALT: 13 U/L (ref 6–29)
AST: 17 U/L (ref 10–35)
Albumin: 4.6 g/dL (ref 3.6–5.1)
Alkaline phosphatase (APISO): 63 U/L (ref 37–153)
Bilirubin, Direct: 0.1 mg/dL (ref 0.0–0.2)
Globulin: 2.1 g/dL (calc) (ref 1.9–3.7)
Indirect Bilirubin: 0.6 mg/dL (calc) (ref 0.2–1.2)
Total Bilirubin: 0.7 mg/dL (ref 0.2–1.2)
Total Protein: 6.7 g/dL (ref 6.1–8.1)

## 2019-09-16 LAB — CYTOLOGY - PAP
Comment: NEGATIVE
Diagnosis: NEGATIVE
High risk HPV: NEGATIVE

## 2019-09-16 LAB — TSH: TSH: 2.02 mIU/L (ref 0.40–4.50)

## 2019-09-16 NOTE — Progress Notes (Signed)
PAP is normal. HPV high  risk is negative

## 2019-10-18 ENCOUNTER — Other Ambulatory Visit (INDEPENDENT_AMBULATORY_CARE_PROVIDER_SITE_OTHER): Payer: BC Managed Care – PPO

## 2019-10-18 ENCOUNTER — Other Ambulatory Visit: Payer: Self-pay

## 2019-10-18 DIAGNOSIS — D696 Thrombocytopenia, unspecified: Secondary | ICD-10-CM

## 2019-10-19 LAB — CBC WITH DIFFERENTIAL/PLATELET
Absolute Monocytes: 422 cells/uL (ref 200–950)
Basophils Absolute: 19 cells/uL (ref 0–200)
Basophils Relative: 0.4 %
Eosinophils Absolute: 230 cells/uL (ref 15–500)
Eosinophils Relative: 4.8 %
HCT: 44.4 % (ref 35.0–45.0)
Hemoglobin: 14.4 g/dL (ref 11.7–15.5)
Lymphs Abs: 1502 cells/uL (ref 850–3900)
MCH: 29.9 pg (ref 27.0–33.0)
MCHC: 32.4 g/dL (ref 32.0–36.0)
MCV: 92.1 fL (ref 80.0–100.0)
MPV: 11.2 fL (ref 7.5–12.5)
Monocytes Relative: 8.8 %
Neutro Abs: 2626 cells/uL (ref 1500–7800)
Neutrophils Relative %: 54.7 %
Platelets: 227 10*3/uL (ref 140–400)
RBC: 4.82 10*6/uL (ref 3.80–5.10)
RDW: 12.2 % (ref 11.0–15.0)
Total Lymphocyte: 31.3 %
WBC: 4.8 10*3/uL (ref 3.8–10.8)

## 2019-10-19 NOTE — Progress Notes (Signed)
Platelet count now totally normal  no other workup advised

## 2020-11-05 NOTE — Progress Notes (Signed)
Chief Complaint  Patient presents with   Annual Exam     HPI: Patient  Emma Day  65 y.o. comes in today for [place] visit  Doing well  having  Little  ringing  inears she notices  at night  .  Worse at left side. Denies hearing loss and neg fam hx .   Fam hx of curvature upper back but no dx osteoporosis per se .   Health Maintenance  Topic Date Due   HIV Screening  Never done   INFLUENZA VACCINE  10/01/2020   COLONOSCOPY (Pts 45-56yr Insurance coverage will need to be confirmed)  11/11/2020   Zoster Vaccines- Shingrix (2 of 2) 01/01/2021   MAMMOGRAM  06/07/2021   PAP SMEAR-Modifier  09/14/2022   TETANUS/TDAP  09/04/2025   COVID-19 Vaccine  Completed   Hepatitis C Screening  Completed   Pneumococcal Vaccine [id number] old  Aged Out   HPV VACCINES  Aged Out   Health Maintenance Review LIFESTYLE:  Exercise:   a lot  3 miles per day.  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   Coronary artery disease Other    Parkinsonism Other    Other Other        low hdl   Breast cancer Maternal Grandmother    Colon cancer Neg Hx    Esophageal cancer Neg Hx    Rectal cancer Neg Hx    Stomach cancer Neg Hx     Social History   Socioeconomic History   Marital status: Married    Spouse name: Not on file   Number of children: Not on file   Years of education: Not on file   Highest education level: Not on file  Occupational  History   Not on file  Tobacco Use   Smoking status: Never   Smokeless tobacco: Never  Vaping Use   Vaping Use: Never used  Substance and Sexual Activity   Alcohol use: Yes    Alcohol/week: 7.0 standard drinks    Types: 7 Standard drinks or equivalent per week    Comment: socially   Drug use: No   Sexual activity: Not on file  Other Topics Concern   Not on file  Social History Narrative   Married   G2P2   HH of  2   After august.   Son to [employer]    Pet 2 dogs and 1 horse.                Social Determinants of Health   Financial Resource Strain: Not on file  Food Insecurity: Not on file  Transportation Needs: Not on file  Physical Activity: Not on file  Stress: Not on file  Social Connections: Not on file  Outpatient Medications Prior to Visit  Medication Sig Dispense Refill   Ferrous Sulfate (IRON PO) Take 60 mg by mouth in the morning and at bedtime.     FOLIC ACID PO Take by mouth.     Multiple Vitamin (MULTIVITAMIN ADULT PO) Take by mouth.     Facility-Administered Medications Prior to Visit  Medication Dose Route Frequency Provider Last Rate Last Admin   0.9 %  sodium chloride infusion  500 mL Intravenous Continuous Ladene Artist, MD         EXAM:  BP 130/66 (BP Location: Left Arm, Patient Position: Sitting, Cuff Size: Normal)   Pulse 71   Temp 98 F (36.7 C) (Oral)   Ht 5' 4.03" (1.626 m)   Wt 138 lb 6.4 oz (62.8 kg)   LMP 12/02/2014   SpO2 99%   BMI 23.74 kg/m   Body mass index is 23.74 kg/m. Wt Readings from Last 3 Encounters:  11/06/20 138 lb 6.4 oz (62.8 kg)  09/14/19 137 lb (62.1 kg)  09/08/17 131 lb (59.4 kg)    Physical Exam: Vital signs reviewed [id number] is a well-developed well-nourished alert cooperative    who appearsr stated age in no acute distress.  HEENT: normocephalic atraumatic , Eyes: PERRL EOM's full, conjunctiva clear, Nares: paten,t no deformity discharge or tenderness., Ears: no deformity EAC's clear TMs with  normal landmarks. Mouth:masked NECK: supple without masses, thyromegaly or bruits. CHEST/PULM:  Clear to auscultation and percussion breath sounds equal no wheeze , rales or rhonchi. No chest wall deformities or tenderness. Mild kyphosis  Breast: normal by inspection . No dimpling, discharge, masses, tenderness or discharge . CV: PMI is nondisplaced, S1 S2 no gallops, murmurs, rubs. Peripheral pulses are full without delay.No JVD .  ABDOMEN: Bowel sounds normal nontender  No guard or rebound, no hepato splenomegal no CVA tenderness.   Extremtities:  No clubbing cyanosis or edema, no acute joint swelling or redness no focal atrophy NEURO:  Oriented x3, cranial nerves 3-12 appear to be intact, no obvious focal weakness,gait within normal limits no abnormal reflexes or asymmetrical SKIN: No acute rashes normal turgor, color, no bruising or petechiae. PSYCH: Oriented, good eye contact, no obvious depression anxiety, cognition and judgment appear normal. LN: no cervical axillary inguinal adenopathy  Lab Results  Component Value Date   WBC 5.2 11/06/2020   HGB 13.8 11/06/2020   HCT 42.0 11/06/2020   PLT 213.0 11/06/2020   GLUCOSE 113 (H) 11/06/2020   CHOL 199 11/06/2020   TRIG 85.0 11/06/2020   HDL 70.90 11/06/2020   LDLDIRECT 123.5 05/26/2011   LDLCALC 111 (H) 11/06/2020   ALT 12 11/06/2020   AST 15 11/06/2020   NA 141 11/06/2020   K 4.4 11/06/2020   CL 104 11/06/2020   CREATININE 0.72 11/06/2020   BUN 14 11/06/2020   CO2 28 11/06/2020   TSH 3.74 11/06/2020   HGBA1C 5.6 11/06/2020    BP Readings from Last 3 Encounters:  11/06/20 130/66  09/14/19 118/68  09/08/17 113/75    Lab planreviewed with patient   ASSESSMENT AND PLAN:  Discussed the following assessment and plan:    ICD-10-CM   1. Visit for preventive health examination  [id number] Basic metabolic panel    CBC with Differential/Platelet    Hemoglobin A1c    Hepatic function panel    Lipid panel    TSH    TSH     Lipid panel    Hepatic function panel    Hemoglobin A1c  CBC with Differential/Platelet    Basic metabolic panel    2. Fam hx-ischem heart disease mom  [id number] Basic metabolic panel    CBC with Differential/Platelet    Hemoglobin A1c    Hepatic function panel    Lipid panel    TSH    TSH    Lipid panel    Hepatic function panel    Hemoglobin A1c    CBC with Differential/Platelet    Basic metabolic panel    3. Ringing in ear, bilateral  [id number] Basic metabolic panel    CBC with Differential/Platelet    Hemoglobin A1c    Hepatic function panel    Lipid panel    TSH    Ambulatory referral to ENT    TSH    Lipid panel    Hepatic function panel    Hemoglobin A1c    CBC with Differential/Platelet    Basic metabolic panel   left more than right    4. Need for shingles vaccine  Z23 Varicella-zoster vaccine IM    Discussed for evaluation of tinnitus without family history or obvious hearing loss and asymmetry Suggest ENT consult with hopefully audiologic evaluation.  Discussed differential diagnosis may be no specific intervention except ear protection. Consider DEXA next year continue resistance exercise adequate dietary input. Suggest yearly mammogram because of history of dense breast. Return in about 1 year (around 11/06/2021) for depending on results, preventive /cpx   ,2-6 mos shingrix vaccine#2.  Patient Care Team: Lan Entsminger, Standley Brooking, MD as PCP - General Arvella Nigh, MD (Obstetrics and Gynecology) Patient Instructions  Good to see you today .  Will do ENT   referral for ringing in ear ;  exam is ok today.   Will notify you  of labs when available.   Shingrix vaccine.   Repeat in 2-6 months  ( can make  injection appt) Flu vaccine  in October or   when convenient .   Continue lifestyle intervention healthy eating and exercise .    Standley Brooking. Clariza Sickman M.D.

## 2020-11-06 ENCOUNTER — Other Ambulatory Visit: Payer: Self-pay

## 2020-11-06 ENCOUNTER — Encounter: Payer: Self-pay | Admitting: Internal Medicine

## 2020-11-06 ENCOUNTER — Ambulatory Visit (INDEPENDENT_AMBULATORY_CARE_PROVIDER_SITE_OTHER): Payer: BC Managed Care – PPO | Admitting: Internal Medicine

## 2020-11-06 VITALS — BP 130/66 | HR 71 | Temp 98.0°F | Ht 64.03 in | Wt 138.4 lb

## 2020-11-06 DIAGNOSIS — H9313 Tinnitus, bilateral: Secondary | ICD-10-CM

## 2020-11-06 DIAGNOSIS — Z8249 Family history of ischemic heart disease and other diseases of the circulatory system: Secondary | ICD-10-CM

## 2020-11-06 DIAGNOSIS — Z0000 Encounter for general adult medical examination without abnormal findings: Secondary | ICD-10-CM

## 2020-11-06 DIAGNOSIS — Z23 Encounter for immunization: Secondary | ICD-10-CM | POA: Diagnosis not present

## 2020-11-06 LAB — LIPID PANEL
Cholesterol: 199 mg/dL (ref 0–200)
HDL: 70.9 mg/dL (ref >39.00)
LDL Cholesterol: 111 mg/dL — ABNORMAL HIGH (ref 0–99)
NonHDL: 127.89
Total CHOL/HDL Ratio: 3
Triglycerides: 85 mg/dL (ref 0.0–149.0)
VLDL: 17 mg/dL (ref 0.0–40.0)

## 2020-11-06 LAB — BASIC METABOLIC PANEL
BUN: 14 mg/dL (ref 6–23)
CO2: 28 mEq/L (ref 19–32)
Calcium: 9.6 mg/dL (ref 8.4–10.5)
Chloride: 104 mEq/L (ref 96–112)
Creatinine, Ser: 0.72 mg/dL (ref 0.40–1.20)
GFR: 89.17 mL/min (ref >60.00)
Glucose, Bld: 113 mg/dL — ABNORMAL HIGH (ref 70–99)
Potassium: 4.4 mEq/L (ref 3.5–5.1)
Sodium: 141 mEq/L (ref 135–145)

## 2020-11-06 LAB — TSH: TSH: 3.74 u[IU]/mL (ref 0.35–5.50)

## 2020-11-06 LAB — CBC WITH DIFFERENTIAL/PLATELET
Basophils Absolute: 0 10*3/uL (ref 0.0–0.1)
Basophils Relative: 0.4 % (ref 0.0–3.0)
Eosinophils Absolute: 0.1 10*3/uL (ref 0.0–0.7)
Eosinophils Relative: 2.5 % (ref 0.0–5.0)
HCT: 42 % (ref 36.0–46.0)
Hemoglobin: 13.8 g/dL (ref 12.0–15.0)
Lymphocytes Relative: 21.8 % (ref 12.0–46.0)
Lymphs Abs: 1.1 10*3/uL (ref 0.7–4.0)
MCHC: 32.8 g/dL (ref 30.0–36.0)
MCV: 90.6 fl (ref 78.0–100.0)
Monocytes Absolute: 0.4 10*3/uL (ref 0.1–1.0)
Monocytes Relative: 7.9 % (ref 3.0–12.0)
Neutro Abs: 3.5 10*3/uL (ref 1.4–7.7)
Neutrophils Relative %: 67.4 % (ref 43.0–77.0)
Platelets: 213 10*3/uL (ref 150.0–400.0)
RBC: 4.64 Mil/uL (ref 3.87–5.11)
RDW: 13.3 % (ref 11.5–15.5)
WBC: 5.2 10*3/uL (ref 4.0–10.5)

## 2020-11-06 LAB — HEPATIC FUNCTION PANEL
ALT: 12 U/L (ref 0–35)
AST: 15 U/L (ref 0–37)
Albumin: 4.5 g/dL (ref 3.5–5.2)
Alkaline Phosphatase: 63 U/L (ref 39–117)
Bilirubin, Direct: 0.1 mg/dL (ref 0.0–0.3)
Total Bilirubin: 0.7 mg/dL (ref 0.2–1.2)
Total Protein: 6.7 g/dL (ref 6.0–8.3)

## 2020-11-06 LAB — HEMOGLOBIN A1C: Hgb A1c MFr Bld: 5.6 % (ref 4.6–6.5)

## 2020-11-06 NOTE — Patient Instructions (Addendum)
Good to see you today .  Will do ENT   referral for ringing in ear ;  exam is ok today.   Will notify you  of labs when available.   Shingrix vaccine.   Repeat in 2-6 months  ( can make  injection appt) Flu vaccine  in October or   when convenient .   Continue lifestyle intervention healthy eating and exercise .

## 2020-11-06 NOTE — Progress Notes (Signed)
Lab normal or stable   no diabetes , (although blood sugar  slightly elevated ) Low risk  10 year score.  The 10-year ASCVD risk score Mikey Bussing DC Brooke Bonito., et al., 2013) is: 4.1%   Values used to calculate the score:     Age: 65 years     Sex: Female     Is Non-Hispanic African American: No     Diabetic: No     Tobacco smoker: No     Systolic Blood Pressure: [id number] mmHg     Is BP treated: No     HDL Cholesterol: 70.9 mg/dL     Total Cholesterol: 199 mg/dL

## 2020-11-13 ENCOUNTER — Encounter: Payer: Self-pay | Admitting: Gastroenterology

## 2020-11-19 ENCOUNTER — Other Ambulatory Visit: Payer: Self-pay

## 2020-11-19 ENCOUNTER — Ambulatory Visit (INDEPENDENT_AMBULATORY_CARE_PROVIDER_SITE_OTHER): Payer: BC Managed Care – PPO | Admitting: Otolaryngology

## 2020-11-19 DIAGNOSIS — H6982 Other specified disorders of Eustachian tube, left ear: Secondary | ICD-10-CM | POA: Diagnosis not present

## 2020-11-19 DIAGNOSIS — H9042 Sensorineural hearing loss, unilateral, left ear, with unrestricted hearing on the contralateral side: Secondary | ICD-10-CM | POA: Diagnosis not present

## 2020-11-19 MED ORDER — TRIAMCINOLONE ACETONIDE 55 MCG/ACT NA AERO: 2.0000 | INHALATION_SPRAY | Freq: Every day | NASAL | 12 refills | Status: DC

## 2020-11-19 NOTE — Progress Notes (Signed)
HPI: Emma Day is a 65 y.o. female who presents is referred by her PCP Dr. Regis Bill for evaluation of a complaints of tinnitus.  She apparently has had this for couple of years but feels like it is gotten worse especially on the left side.  She also complains of occasional pressure in the left ear that comes and goes.  She has not noted any hearing problems.  Denies any loud noise exposure or trauma to the left ear..  Past Medical History:  Diagnosis Date   History of ovarian cyst    Syncope    Past Surgical History:  Procedure Laterality Date   BREAST BIOPSY Left    HERNIA REPAIR  04/29/2007   inguinal rt    Social History   Socioeconomic History   Marital status: Married    Spouse name: Not on file   Number of children: Not on file   Years of education: Not on file   Highest education level: Not on file  Occupational History   Not on file  Tobacco Use   Smoking status: Never   Smokeless tobacco: Never  Vaping Use   Vaping Use: Never used  Substance and Sexual Activity   Alcohol use: Yes    Alcohol/week: 7.0 standard drinks    Types: 7 Standard drinks or equivalent per week    Comment: socially   Drug use: No   Sexual activity: Not on file  Other Topics Concern   Not on file  Social History Narrative   Married   G2P2   HH of  2   After august.   Son to [employer]    Pet 2 dogs and 1 horse.                Social Determinants of Health   Financial Resource Strain: Not on file  Food Insecurity: Not on file  Transportation Needs: Not on file  Physical Activity: Not on file  Stress: Not on file  Social Connections: Not on file   Family History  Problem Relation Age of Onset   Colon polyps Father        pt's father was in his 55's. lg polyp removed surgically and part of colon was removed.   Coronary artery disease Other    Parkinsonism Other    Other Other        low hdl   Breast cancer Maternal Grandmother    Colon cancer Neg Hx    Esophageal cancer Neg  Hx    Rectal cancer Neg Hx    Stomach cancer Neg Hx    No Known Allergies Prior to Admission medications   Medication Sig Start Date End Date Taking? Authorizing Provider  Ferrous Sulfate (IRON PO) Take 60 mg by mouth in the morning and at bedtime.    [provider]  FOLIC ACID PO Take by mouth.    [provider]  Multiple Vitamin (MULTIVITAMIN ADULT PO) Take by mouth.    [provider]     Positive ROS: Otherwise negative  All other systems have been reviewed and were otherwise negative with the exception of those mentioned in the HPI and as above.  Physical Exam: Constitutional: Alert, well-appearing, no acute distress Ears: External ears without lesions or tenderness. Ear canals are clear bilaterally with intact, clear TMs.  No middle ear effusion noted on either side. Nasal: External nose without lesions. Septum is deviated to the left.  With mild rhinitis. . On nasal endoscopy both  middle meatus regions are clear.  The nasopharynx is clear.  Both eustachian tube openings are unobstructed with no nasopharyngeal problems noted Oral: Lips and gums without lesions. Tongue and palate mucosa without lesions. Posterior oropharynx clear. Neck: No palpable adenopathy or masses Respiratory: Breathing comfortably  Skin: No facial/neck lesions or rash noted.  Audiologic testing in the office today demonstrated a left ear mild sensorineural hearing loss compared to the right.  Left ear also demonstrated a type C tympanogram versus a type A tympanogram on the right side.  SRT's were 15 dB on the right and 30 dB on the left.   Procedures  Assessment: Left eustachian tube dysfunction.  Normal eustachian tube exam on nasopharyngoscopy. 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  As well as asymmetric  sensorineural hearing loss slightly more pronounced on the left side. Reviewed with her concerning use of nasal steroid spray which should help with the pressure or congestion in the left ear and placed her on Nasacort 2 sprays each nostril at night. I also reviewed with her concerning further evaluation of asymmetric sensorineural hearing loss with MRI scan to rule out any cochlear or retrocochlear pathology.  She would like to wait on scheduling this.Radene Journey, MD   CC:

## 2020-11-21 ENCOUNTER — Encounter (INDEPENDENT_AMBULATORY_CARE_PROVIDER_SITE_OTHER): Payer: Self-pay

## 2020-12-24 ENCOUNTER — Telehealth: Payer: Self-pay | Admitting: Internal Medicine

## 2020-12-24 NOTE — Telephone Encounter (Signed)
She could do that but suggest space them out to avoid confusion if gets a side effect.

## 2020-12-24 NOTE — Telephone Encounter (Signed)
Patient is wanting to know if she could get her flu shot and second covid shot prior to her shingles shot on 11/04.  Patient could be contacted at [phone].  Please advise.

## 2020-12-27 NOTE — Telephone Encounter (Signed)
Pt informed of the message and verbalized understanding  

## 2020-12-30 NOTE — Telephone Encounter (Signed)
Yes

## 2021-01-07 ENCOUNTER — Other Ambulatory Visit: Payer: Self-pay

## 2021-01-07 ENCOUNTER — Ambulatory Visit (INDEPENDENT_AMBULATORY_CARE_PROVIDER_SITE_OTHER): Payer: BC Managed Care – PPO

## 2021-01-07 DIAGNOSIS — Z23 Encounter for immunization: Secondary | ICD-10-CM

## 2021-01-07 NOTE — Progress Notes (Deleted)
n/v

## 2021-01-07 NOTE — Progress Notes (Signed)
Per orders of Dr. Regis Bill, injection of Shingles Vaccine given by Wyvonne Lenz. Patient tolerated injection well.

## 2021-02-12 ENCOUNTER — Other Ambulatory Visit: Payer: Self-pay | Admitting: Internal Medicine

## 2021-02-12 ENCOUNTER — Encounter: Payer: Self-pay | Admitting: Gastroenterology

## 2021-02-12 DIAGNOSIS — Z1231 Encounter for screening mammogram for malignant neoplasm of breast: Secondary | ICD-10-CM

## 2021-03-14 ENCOUNTER — Ambulatory Visit
Admission: RE | Admit: 2021-03-14 | Discharge: 2021-03-14 | Disposition: A | Discharge status: Home / Self Care | Payer: BC Managed Care – PPO | Source: Ambulatory Visit | Attending: Internal Medicine | Admitting: Internal Medicine

## 2021-03-14 ENCOUNTER — Other Ambulatory Visit: Payer: Self-pay

## 2021-03-14 DIAGNOSIS — Z1231 Encounter for screening mammogram for malignant neoplasm of breast: Secondary | ICD-10-CM

## 2021-03-27 ENCOUNTER — Ambulatory Visit (AMBULATORY_SURGERY_CENTER): Payer: BC Managed Care – PPO | Admitting: *Deleted

## 2021-03-27 ENCOUNTER — Other Ambulatory Visit: Payer: Self-pay

## 2021-03-27 VITALS — Ht 64.0 in | Wt 136.0 lb

## 2021-03-27 DIAGNOSIS — Z86010 Personal history of colonic polyps: Secondary | ICD-10-CM

## 2021-03-27 DIAGNOSIS — Z8371 Family history of colonic polyps: Secondary | ICD-10-CM

## 2021-03-27 MED ORDER — NA SULFATE-K SULFATE-MG SULF 17.5-3.13-1.6 GM/177ML PO SOLN: 1.0000 | ORAL | 0 refills | Status: DC

## 2021-03-27 NOTE — Progress Notes (Signed)

## 2021-04-08 ENCOUNTER — Encounter: Payer: Self-pay | Admitting: Gastroenterology

## 2021-04-10 ENCOUNTER — Encounter: Payer: Self-pay | Admitting: Gastroenterology

## 2021-04-10 ENCOUNTER — Other Ambulatory Visit: Payer: Self-pay

## 2021-04-10 ENCOUNTER — Ambulatory Visit (AMBULATORY_SURGERY_CENTER): Payer: BC Managed Care – PPO | Admitting: Gastroenterology

## 2021-04-10 VITALS — BP 117/71 | HR 65 | Temp 97.5°F | Resp 10 | Ht 64.0 in | Wt 138.0 lb

## 2021-04-10 DIAGNOSIS — Z86010 Personal history of colonic polyps: Secondary | ICD-10-CM | POA: Diagnosis present

## 2021-04-10 MED ORDER — SODIUM CHLORIDE 0.9 % IV SOLN: 500.0000 mL | Freq: Once | INTRAVENOUS | Status: DC

## 2021-04-10 NOTE — Patient Instructions (Signed)
Handout on diverticulosis and high fiber given    YOU HAD AN ENDOSCOPIC PROCEDURE TODAY AT [facility]:   Refer to the procedure report that was given to you for any specific questions about what was found during the examination.  If the procedure report does not answer your questions, please call your gastroenterologist to clarify.  If you requested that your care partner not be given the details of your procedure findings, then the procedure report has been included in a sealed envelope for you to review at your convenience later.  YOU SHOULD EXPECT: Some feelings of bloating in the abdomen. Passage of more gas than usual.  Walking can help get rid of the air that was put into your GI tract during the procedure and reduce the bloating. If you had a lower endoscopy (such as a colonoscopy or flexible sigmoidoscopy) you may notice spotting of blood in your stool or on the toilet paper. If you underwent a bowel prep for your procedure, you may not have a normal bowel movement for a few days.  Please Note:  You might notice some irritation and congestion in your nose or some drainage.  This is from the oxygen used during your procedure.  There is no need for concern and it should clear up in a day or so.  SYMPTOMS TO REPORT IMMEDIATELY:  Following lower endoscopy (colonoscopy or flexible sigmoidoscopy):  Excessive amounts of blood in the stool  Significant tenderness or worsening of abdominal pains  Swelling of the abdomen that is new, acute  Fever of 100F or higher  For urgent or emergent issues, a gastroenterologist can be reached at any hour by calling [phone]. Do not use MyChart messaging for urgent concerns.    DIET:  We do recommend a small meal at first, but then you may proceed to your regular diet.  Drink plenty of fluids but you should avoid alcoholic beverages for 24 hours.  ACTIVITY:  You should plan to take it easy for the rest of today and you should NOT  DRIVE or use heavy machinery until tomorrow (because of the sedation medicines used during the test).    FOLLOW UP: Our staff will call the number listed on your records 48-72 hours following your procedure to check on you and address any questions or concerns that you may have regarding the information given to you following your procedure. If we do not reach you, we will leave a message.  We will attempt to reach you two times.  During this call, we will ask if you have developed any symptoms of COVID 19. If you develop any symptoms (ie: fever, flu-like symptoms, shortness of breath, cough etc.) before then, please call [phone].  If you test positive for Covid 19 in the 2 weeks post procedure, please call and report this information to [place].    If any biopsies were taken you will be contacted by phone or by letter within the next 1-3 weeks.  Please call us at [phone] if you have not heard about the biopsies in 3 weeks.    SIGNATURES/CONFIDENTIALITY: You and/or your care partner have signed paperwork which will be entered into your electronic medical record.  These signatures attest to the fact that that the information above on your After Visit Summary has been reviewed and is understood.  Full responsibility of the confidentiality of this discharge information lies with you and/or your care-partner.

## 2021-04-10 NOTE — Progress Notes (Signed)
History & Physical  Primary Care Physician:  Burnis Medin, MD Primary Gastroenterologist: Lucio Edward, MD  CHIEF COMPLAINT:  Personal history of colon polyps   HPI: Emma Day is a 66 y.o. 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   Past Medical History:  Diagnosis Date   History of ovarian cyst    Syncope     Past Surgical History:  Procedure Laterality Date   BREAST BIOPSY Left    CATARACT EXTRACTION     COLONOSCOPY  11/12/2015   Dr.Lular Letson   HERNIA REPAIR  04/29/2007   inguinal rt     Prior to Admission medications   Medication Sig Start Date End Date Taking? Authorizing Provider  FOLIC ACID PO Take by mouth daily.   Yes [provider]  Multiple Vitamin (MULTIVITAMIN ADULT PO) Take by mouth daily.   Yes [provider]  Ferrous Sulfate (IRON PO) Take 60 mg by mouth 2 (two) times a week.    [provider]    Current Outpatient Medications  Medication Sig Dispense Refill   FOLIC ACID PO Take by mouth daily.     Multiple Vitamin (MULTIVITAMIN ADULT PO) Take by mouth daily.     Ferrous Sulfate (IRON PO) Take 60 mg by mouth 2 (two) times a week.     Current Facility-Administered Medications  Medication Dose Route Frequency Provider Last Rate Last Admin   0.9 %  sodium chloride infusion  500 mL Intravenous Once Ladene Artist, MD        Allergies as of 04/10/2021   (No Known Allergies)    Family History  Problem Relation Age of Onset   Colon polyps Father        pt's father was in his 76's. lg polyp removed surgically and part of colon was removed.   Coronary artery disease Other    Parkinsonism Other    Other Other        low hdl   Breast cancer Maternal Grandmother    Colon cancer Neg Hx    Esophageal cancer Neg Hx    Rectal cancer Neg Hx    Stomach cancer Neg Hx     Social History   Socioeconomic History   Marital  status: Married    Spouse name: Not on file   Number of children: Not on file   Years of education: Not on file   Highest education level: Not on file  Occupational History   Not on file  Tobacco Use   Smoking status: Never   Smokeless tobacco: Never  Vaping Use   Vaping Use: Never used  Substance and Sexual Activity   Alcohol use: Yes    Alcohol/week: 7.0 standard drinks    Types: 7 Standard drinks or equivalent per week    Comment: socially   Drug use: No   Sexual activity: Not on file  Other Topics Concern   Not on file  Social History Narrative   Married   G2P2   HH of  2   After august.   Son to [employer]    Pet 2 dogs and 1 horse.                Social Determinants of Health   Financial Resource Strain: Not on file  Food Insecurity: Not on file  Transportation Needs: Not on file  Physical Activity: Not on file  Stress: Not on file  Social Connections: Not on file  Intimate Partner Violence: Not on file    Review of Systems:  All systems reviewed an negative except where noted in HPI.  Gen: Denies any fever, chills, sweats, anorexia, fatigue, weakness, malaise, weight loss, and sleep disorder CV: Denies chest pain, angina, palpitations, syncope, orthopnea, PND, peripheral edema, and claudication. Resp: Denies dyspnea at rest, dyspnea with exercise, cough, sputum, wheezing, coughing up blood, and pleurisy. GI: Denies vomiting blood, jaundice, and fecal incontinence.   Denies dysphagia or odynophagia. GU : Denies urinary burning, blood in urine, urinary frequency, urinary hesitancy, nocturnal urination, and urinary incontinence. MS: Denies joint pain, limitation of movement, and swelling, stiffness, low back pain, extremity pain. Denies muscle weakness, cramps, atrophy.  Derm: Denies rash, itching, dry skin, hives, moles, warts, or unhealing ulcers.  Psych: Denies depression, anxiety, memory loss, suicidal ideation, hallucinations, paranoia, and  confusion. Heme: Denies bruising, bleeding, and enlarged lymph nodes. Neuro:  Denies any headaches, dizziness, paresthesias. Endo:  Denies any problems with DM, thyroid, adrenal function.   Physical Exam: General:  Alert, well-developed, in NAD Head:  Normocephalic and atraumatic. Eyes:  Sclera clear, no icterus.   Conjunctiva pink. Ears:  Normal auditory acuity. Mouth:  No deformity or lesions.  Neck:  Supple; no masses . Lungs:  Clear throughout to auscultation.   No wheezes, crackles, or rhonchi. No acute distress. Heart:  Regular rate and rhythm; no murmurs. Abdomen:  Soft, nondistended, nontender. No masses, hepatomegaly. No obvious masses.  Normal bowel .    Rectal:  Deferred   Msk:  Symmetrical without gross deformities.. Pulses:  Normal pulses noted. Extremities:  Without edema. Neurologic:  Alert and  oriented x4;  grossly normal neurologically. Skin:  Intact without significant lesions or rashes. Cervical Nodes:  No significant cervical adenopathy. Psych:  Alert and cooperative. Normal mood and affect.   Impression / Plan:   Personal history of a tubular adenoma and a sessile serrated adenoma on colonoscopy in 2017 for surveillance colonoscopy today.  [name]. [name]  04/10/2021, 9:35 AM See [name], [name], to contact our on call provider

## 2021-04-10 NOTE — Op Note (Signed)
[name] Procedure Date: 04/10/2021 9:34 AM MRN: [id number] Endoscopist: Ladene Artist , MD Age: 66 Referring MD:  Date of Birth: September 28, 1955 Gender: Female Account #: [id number] Procedure:                Colonoscopy Indications:              Surveillance: Personal history of adenomatous and                            sessile serrated polyps on last colonoscopy > 5                            years ago Medicines:                Monitored Anesthesia Care Procedure:                Pre-Anesthesia Assessment:                           - Prior to the procedure, a History and Physical                            was performed, and patient medications and                            allergies were reviewed. The patient's tolerance of                            previous anesthesia was also reviewed. The risks                            and benefits of the procedure and the sedation                            options and risks were discussed with the patient.                            All questions were answered, and informed consent                            was obtained. Prior Anticoagulants: The patient has                            taken no previous anticoagulant or antiplatelet                            agents. ASA Grade Assessment: II - A patient with                            mild systemic disease. After reviewing the risks                            and benefits, the patient was deemed in  satisfactory condition to undergo the procedure.                           After obtaining informed consent, the colonoscope                            was passed under direct vision. Throughout the                            procedure, the patient's blood pressure, pulse, and                            oxygen saturations were monitored continuously. The                            Olympus PCF-H190DL (#[id number]) Colonoscope was                             introduced through the anus and advanced to the the                            cecum, identified by appendiceal orifice and                            ileocecal valve. The ileocecal valve, appendiceal                            orifice, and rectum were photographed. The quality                            of the bowel preparation was good. The colonoscopy                            was performed without difficulty. The patient                            tolerated the procedure well. Scope In: 9:47:44 AM Scope Out: 10:10:29 AM Scope Withdrawal Time: 0 hours 14 minutes 1 second  Total Procedure Duration: 0 hours 22 minutes 45 seconds  Findings:                 The perianal and digital rectal examinations were                            normal.                           A few small-mouthed diverticula were found in the                            left colon. There was no evidence of diverticular                            bleeding.  Internal hemorrhoids were found during                            retroflexion. The hemorrhoids were small and Grade                            I (internal hemorrhoids that do not prolapse).                           The exam was otherwise without abnormality on                            direct and retroflexion views. Complications:            No immediate complications. Estimated blood loss:                            None. Estimated Blood Loss:     Estimated blood loss: none. Impression:               - Mild diverticulosis in the left colon.                           - Internal hemorrhoids.                           - The examination was otherwise normal on direct                            and retroflexion views.                           - No specimens collected. Recommendation:           - Repeat colonoscopy in 7 years for surveillance.                           - Patient has a contact number available for                             emergencies. The signs and symptoms of potential                            delayed complications were discussed with the                            patient. Return to normal activities tomorrow.                            Written discharge instructions were provided to the                            patient.                           - High fiber diet.                           -  Continue present medications. Ladene Artist, MD 04/10/2021 10:15:21 AM This report has been signed electronically.

## 2021-04-10 NOTE — Progress Notes (Signed)
Pt non-responsive, VVS, Report to RN  °

## 2021-04-10 NOTE — Progress Notes (Signed)
Vitals-CW  Pt's states no medical or surgical changes since previsit or office visit. 

## 2021-04-12 ENCOUNTER — Telehealth: Payer: Self-pay

## 2021-04-12 NOTE — Telephone Encounter (Signed)
Left message on follow up call. 

## 2021-08-05 ENCOUNTER — Telehealth: Payer: Self-pay
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
## 2021-08-05 NOTE — Progress Notes (Unsigned)
    ACUTE VISIT No chief complaint on file.  HPI: [name] is a 66 y.o. female, who is here today complaining of *** HPI  Review of Systems Rest see pertinent positives and negatives per HPI.  Current Outpatient Medications on File Prior to Visit  Medication Sig Dispense Refill   Ferrous Sulfate (IRON PO) Take 60 mg by mouth 2 (two) times a week.     FOLIC ACID PO Take by mouth daily.     Multiple Vitamin (MULTIVITAMIN ADULT PO) Take by mouth daily.     No current facility-administered medications on file prior to visit.     Past Medical History:  Diagnosis Date   History of ovarian cyst    Syncope    No Known Allergies  Social History   Socioeconomic History   Marital status: Married    Spouse name: Not on file   Number of children: Not on file   Years of education: Not on file   Highest education level: Bachelor's degree (e.g., BA, AB, BS)  Occupational History   Not on file  Tobacco Use   Smoking status: Never   Smokeless tobacco: Never  Vaping Use   Vaping Use: Never used  Substance and Sexual Activity   Alcohol use: Yes    Alcohol/week: 7.0 standard drinks    Types: 7 Standard drinks or equivalent per week    Comment: socially   Drug use: No   Sexual activity: Not on file  Other Topics Concern   Not on file  Social History Narrative   Married   G2P2   HH of  2   After august.   Son to [employer]    Pet 2 dogs and 1 horse.                Social Determinants of Health   Financial Resource Strain: Low Risk    Difficulty of Paying Living Expenses: Not hard at all  Food Insecurity: No Food Insecurity   Worried About Charity fundraiser in the Last Year: Never true   [place] in the Last Year: Never true  Transportation Needs: No Transportation Needs   Lack of Transportation (Medical): No   Lack of Transportation (Non-Medical): No  Physical Activity: Sufficiently Active   Days of Exercise per Week: 7 days   Minutes of Exercise per  Session: 40 min  Stress: No Stress Concern Present   Feeling of Stress : Not at all  Social Connections: Socially Integrated   Frequency of Communication with Friends and Family: More than three times a week   Frequency of Social Gatherings with Friends and Family: More than three times a week   Attends Religious Services: 1 to 4 times per year   Active Member of [employer] or Organizations: Yes   Attends Music therapist: More than 4 times per year   Marital Status: Married    There were no vitals filed for this visit. There is no height or weight on file to calculate BMI.  Physical Exam  ASSESSMENT AND PLAN:  There are no diagnoses linked to this encounter.   No follow-ups on file.   Emma Linders G. Martinique, MD  [facility]. [place] office.  Discharge Instructions   None

## 2021-08-06 ENCOUNTER — Ambulatory Visit (INDEPENDENT_AMBULATORY_CARE_PROVIDER_SITE_OTHER): Payer: BC Managed Care – PPO

## 2021-08-06 ENCOUNTER — Encounter: Payer: Self-pay | Admitting: Family Medicine

## 2021-08-06 ENCOUNTER — Ambulatory Visit (INDEPENDENT_AMBULATORY_CARE_PROVIDER_SITE_OTHER): Payer: BC Managed Care – PPO | Admitting: Family Medicine

## 2021-08-06 VITALS — BP 128/80 | HR 88 | Resp 16 | Ht 64.0 in | Wt 136.5 lb

## 2021-08-06 DIAGNOSIS — R0781 Pleurodynia: Secondary | ICD-10-CM

## 2021-08-06 DIAGNOSIS — S3992XA Unspecified injury of lower back, initial encounter: Secondary | ICD-10-CM | POA: Diagnosis not present

## 2021-08-06 DIAGNOSIS — W19XXXA Unspecified fall, initial encounter: Secondary | ICD-10-CM | POA: Diagnosis not present

## 2021-08-06 MED ORDER — TRAMADOL HCL 50 MG PO TABS: 50.0000 mg | ORAL_TABLET | Freq: Every evening | ORAL | 0 refills | Status: AC | PRN

## 2021-08-06 NOTE — Patient Instructions (Addendum)
A few things to remember from today's visit:  Fall, initial encounter  Injury of back, initial encounter - Plan: DG Thoracic Spine W/Swimmers, DG Lumbar Spine Complete, traMADol (ULTRAM) 50 MG tablet  Costal margin pain - Plan: DG Ribs Unilateral W/Chest Right, traMADol (ULTRAM) 50 MG tablet  If you need refills please call your pharmacy. Do not use My Chart to request refills or for acute issues that need immediate attention.   Avoid shallow breathing. Tramadol t bedtime, 1/2-1 tab. Continue alternating between Tylenol and Ibuprofen (400 mg) during the day.  Please be sure medication list is accurate. If a new problem present, please set up appointment sooner than planned today.

## 2022-03-18 ENCOUNTER — Other Ambulatory Visit: Payer: Self-pay | Admitting: Internal Medicine

## 2022-03-18 DIAGNOSIS — Z1231 Encounter for screening mammogram for malignant neoplasm of breast: Secondary | ICD-10-CM

## 2022-03-19 ENCOUNTER — Encounter: Payer: BC Managed Care – PPO | Admitting: Internal Medicine

## 2022-04-09 ENCOUNTER — Ambulatory Visit (INDEPENDENT_AMBULATORY_CARE_PROVIDER_SITE_OTHER): Payer: BC Managed Care – PPO | Admitting: Internal Medicine

## 2022-04-09 ENCOUNTER — Encounter: Payer: Self-pay | Admitting: Internal Medicine

## 2022-04-09 VITALS — BP 118/70 | HR 84 | Temp 97.5°F | Ht 64.2 in | Wt 134.6 lb

## 2022-04-09 DIAGNOSIS — Z23 Encounter for immunization: Secondary | ICD-10-CM

## 2022-04-09 DIAGNOSIS — H9313 Tinnitus, bilateral: Secondary | ICD-10-CM

## 2022-04-09 DIAGNOSIS — E7800 Pure hypercholesterolemia, unspecified: Secondary | ICD-10-CM

## 2022-04-09 DIAGNOSIS — Z0000 Encounter for general adult medical examination without abnormal findings: Secondary | ICD-10-CM | POA: Diagnosis not present

## 2022-04-09 DIAGNOSIS — Z8249 Family history of ischemic heart disease and other diseases of the circulatory system: Secondary | ICD-10-CM | POA: Diagnosis not present

## 2022-04-09 LAB — LIPID PANEL
Cholesterol: 224 mg/dL — ABNORMAL HIGH (ref 0–200)
HDL: 67.9 mg/dL (ref >39.00)
LDL Cholesterol: 136 mg/dL — ABNORMAL HIGH (ref 0–99)
NonHDL: 155.87
Total CHOL/HDL Ratio: 3
Triglycerides: 98 mg/dL (ref 0.0–149.0)
VLDL: 19.6 mg/dL (ref 0.0–40.0)

## 2022-04-09 LAB — COMPREHENSIVE METABOLIC PANEL
ALT: 16 U/L (ref 0–35)
AST: 18 U/L (ref 0–37)
Albumin: 4.8 g/dL (ref 3.5–5.2)
Alkaline Phosphatase: 53 U/L (ref 39–117)
BUN: 11 mg/dL (ref 6–23)
CO2: 27 mEq/L (ref 19–32)
Calcium: 9.3 mg/dL (ref 8.4–10.5)
Chloride: 104 mEq/L (ref 96–112)
Creatinine, Ser: 0.68 mg/dL (ref 0.40–1.20)
GFR: 91.96 mL/min (ref >60.00)
Glucose, Bld: 104 mg/dL — ABNORMAL HIGH (ref 70–99)
Potassium: 4.3 mEq/L (ref 3.5–5.1)
Sodium: 140 mEq/L (ref 135–145)
Total Bilirubin: 0.7 mg/dL (ref 0.2–1.2)
Total Protein: 7.2 g/dL (ref 6.0–8.3)

## 2022-04-09 LAB — CBC WITH DIFFERENTIAL/PLATELET
Basophils Absolute: 0 10*3/uL (ref 0.0–0.1)
Basophils Relative: 0.4 % (ref 0.0–3.0)
Eosinophils Absolute: 0.1 10*3/uL (ref 0.0–0.7)
Eosinophils Relative: 1.5 % (ref 0.0–5.0)
HCT: 42.2 % (ref 36.0–46.0)
Hemoglobin: 14.2 g/dL (ref 12.0–15.0)
Lymphocytes Relative: 22.3 % (ref 12.0–46.0)
Lymphs Abs: 1.2 10*3/uL (ref 0.7–4.0)
MCHC: 33.7 g/dL (ref 30.0–36.0)
MCV: 90.6 fl (ref 78.0–100.0)
Monocytes Absolute: 0.4 10*3/uL (ref 0.1–1.0)
Monocytes Relative: 6.6 % (ref 3.0–12.0)
Neutro Abs: 3.7 10*3/uL (ref 1.4–7.7)
Neutrophils Relative %: 69.2 % (ref 43.0–77.0)
Platelets: 234 10*3/uL (ref 150.0–400.0)
RBC: 4.66 Mil/uL (ref 3.87–5.11)
RDW: 12.9 % (ref 11.5–15.5)
WBC: 5.4 10*3/uL (ref 4.0–10.5)

## 2022-04-09 LAB — HEMOGLOBIN A1C: Hgb A1c MFr Bld: 5.5 % (ref 4.6–6.5)

## 2022-04-09 LAB — TSH: TSH: 3.22 u[IU]/mL (ref 0.35–5.50)

## 2022-04-09 NOTE — Patient Instructions (Signed)
Good to see you today.  Continue lifestyle intervention healthy eating and exercise . Mar[facility] Kitchen  Lab today.   Consider early hearing  check  especially if  progressive symptoms .

## 2022-04-09 NOTE — Progress Notes (Signed)
Chief Complaint  Patient presents with   Annual Exam    HPI: Patient  Emma Day  66 y.o. comes in today for Preventive Health Care visit  NO major change in health  Had slip on wet surface fall on back recovered and no fx   Dr Lucia Gaskins eval for tinittus and 20  % hearing dec in left ear?   Mentioned acoutisc neuroma in diff dx but no fu advised .  ? Not a lot o f progression at this point.  Health Maintenance  Topic Date Due   COVID-19 Vaccine (6 - 2023-24 season) 04/25/2022 (Originally 11/01/2021)   HIV Screening  04/10/2023 (Originally 09/23/1970)   PAP SMEAR-Modifier  09/14/2022   MAMMOGRAM  03/15/2023   DTaP/Tdap/Td (2 - Td or Tdap) 09/04/2025   COLONOSCOPY (Pts 45-63yr Insurance coverage will need to be confirmed)  04/10/2028   INFLUENZA VACCINE  Completed   Hepatitis C Screening  Completed   Zoster Vaccines- Shingrix  Completed   HPV VACCINES  Aged Out   Health Maintenance Review LIFESTYLE:  Exercise:   walking  3 15 miles  per day  Tobacco/ETS: n Alcohol:  average   Sugar beverages: Sleep: about  7-8  Drug use: no HH of   2 no pets  grandpuppies  Work: [employer] and moving   Last pap done 21 neg hpv   ROS:  GEN/ HEENT: No fever, significant weight changes sweats headaches vision problems hearing changes, CV/ PULM; No chest pain shortness of breath cough, syncope,edema  change in exercise tolerance. GI /GU: No adominal pain, vomiting, change in bowel habits. No blood in the stool. No significant GU symptoms. SKIN/HEME: ,no acute skin rashes suspicious lesions or bleeding. No lymphadenopathy, nodules, masses.  NEURO/ PSYCH:  No neurologic signs such as weakness numbness. No depression anxiety. IMM/ Allergy: No unusual infections.  Allergy .   REST of 12 system review negative except as per HPI   Past Medical History:  Diagnosis Date   History of ovarian cyst    Syncope     Past Surgical History:  Procedure Laterality Date   BREAST  BIOPSY Left    CATARACT EXTRACTION     COLONOSCOPY  11/12/2015   Dr.Stark   HERNIA REPAIR  04/29/2007   inguinal rt     Family History  Problem Relation Age of Onset   Colon polyps Father        pt's father was in his 874's lg polyp removed surgically and part of colon was removed.   Coronary artery disease Other    Parkinsonism Other    Other Other        low hdl   Breast cancer Maternal Grandmother    Colon cancer Neg Hx    Esophageal cancer Neg Hx    Rectal cancer Neg Hx    Stomach cancer Neg Hx     Social History   Socioeconomic History   Marital status: Married    Spouse name: Not on file   Number of children: Not on file   Years of education: Not on file   Highest education level: Bachelor's degree (e.g., BA, AB, BS)  Occupational History   Not on file  Tobacco Use   Smoking status: Never   Smokeless tobacco: Never  Vaping Use   Vaping Use: Never used  Substance and Sexual Activity   Alcohol use: Yes    Alcohol/week: 7.0 standard drinks of alcohol    Types: 7  Standard drinks or equivalent per week    Comment: socially   Drug use: No   Sexual activity: Not on file  Other Topics Concern   Not on file  Social History Narrative   Married   [place] of  2   After august.   Son to [employer]    Pet 2 dogs and 1 horse.                Social Determinants of Health   Financial Resource Strain: Low Risk  (08/05/2021)   Overall Financial Resource Strain (CARDIA)    Difficulty of Paying Living Expenses: Not hard at all  Food Insecurity: No Food Insecurity (08/05/2021)   Hunger Vital Sign    Worried About Running Out of Food in the Last Year: Never true    Ran Out of Food in the Last Year: Never true  Transportation Needs: No Transportation Needs (08/05/2021)   PRAPARE - Hydrologist (Medical): No    Lack of Transportation (Non-Medical): No  Physical Activity: Sufficiently Active (08/05/2021)   Exercise Vital Sign    Days of Exercise  per Week: 7 days    Minutes of Exercise per Session: 40 min  Stress: No Stress Concern Present (08/05/2021)   [place]    Feeling of Stress : Not at all  Social Connections: [place] (08/05/2021)   Social Connection and Isolation Panel [NHANES]    Frequency of Communication with Friends and Family: More than three times a week    Frequency of Social Gatherings with Friends and Family: More than three times a week    Attends Religious Services: 1 to 4 times per year    Active Member of [employer] or Organizations: Yes    Attends Music therapist: More than 4 times per year    Marital Status: Married    Outpatient Medications Prior to Visit  Medication Sig Dispense Refill   Cholecalciferol (VITAMIN D-3 PO) Take by mouth.     Cyanocobalamin (VITAMIN B-12 PO) Take by mouth.     Ferrous Sulfate (IRON PO) Take 60 mg by mouth 2 (two) times a week.     FOLIC ACID PO Take by mouth daily.     Multiple Vitamin (MULTIVITAMIN ADULT PO) Take by mouth daily.     No facility-administered medications prior to visit.     EXAM:  BP 118/70 (BP Location: Left Arm, Patient Position: Sitting, Cuff Size: Normal)   Pulse 84   Temp (!) 97.5 F (36.4 C) (Oral)   Ht 5' 4.2" (1.631 m)   Wt 134 lb 9.6 oz (61.1 kg)   LMP 12/02/2014   SpO2 99%   BMI 22.96 kg/m   Body mass index is 22.96 kg/m. Wt Readings from Last 3 Encounters:  04/09/22 134 lb 9.6 oz (61.1 kg)  08/06/21 136 lb 8 oz (61.9 kg)  04/10/21 138 lb (62.6 kg)    Physical Exam: Vital signs reviewed HLK:TGYB is a well-developed well-nourished alert cooperative    who appearsr stated age in no acute distress.  HEENT: normocephalic atraumatic , Eyes: PERRL EOM's full, conjunctiva clear, Nares: paten,t no deformity discharge or tenderness., Ears: no deformity EAC's clear TMs with normal landmarks. Mouth: clear OP, no lesions, edema.  Moist mucous membranes.  Dentition in adequate repair. NECK: supple without masses, thyromegaly or bruits. CHEST/PULM:  Clear to auscultation and percussion breath sounds equal no wheeze ,  rales or rhonchi. No chest wall deformities or tenderness. Breast: normal by inspection . No dimpling, discharge, masses, tenderness or discharge . CV: PMI is nondisplaced, S1 S2 no gallops, murmurs, rubs. Peripheral pulses are full without delay.No JVD .  ABDOMEN: Bowel sounds normal nontender  No guard or rebound, no hepato splenomegal no CVA tenderness.  Extremtities:  No clubbing cyanosis or edema, no acute joint swelling or redness no focal atrophy NEURO:  Oriented x3, cranial nerves 3-12 appear to be intact, no obvious focal weakness,gait within normal limits no abnormal reflexes or asymmetrical SKIN: No acute rashes normal turgor, color, no bruising or petechiae. PSYCH: Oriented, good eye contact, no obvious depression anxiety, cognition and judgment appear normal. LN: no cervical axillary adenopathy  Lab Results  Component Value Date   WBC 5.2 11/06/2020   HGB 13.8 11/06/2020   HCT 42.0 11/06/2020   PLT 213.0 11/06/2020   GLUCOSE 113 (H) 11/06/2020   CHOL 199 11/06/2020   TRIG 85.0 11/06/2020   HDL 70.90 11/06/2020   LDLDIRECT 123.5 05/26/2011   LDLCALC 111 (H) 11/06/2020   ALT 12 11/06/2020   AST 15 11/06/2020   NA 141 11/06/2020   K 4.4 11/06/2020   CL 104 11/06/2020   CREATININE 0.72 11/06/2020   BUN 14 11/06/2020   CO2 28 11/06/2020   TSH 3.74 11/06/2020   HGBA1C 5.6 11/06/2020    BP Readings from Last 3 Encounters:  04/09/22 118/70  08/06/21 128/80  04/10/21 117/71    Lab plan  reviewed with patient   ASSESSMENT AND PLAN:  Discussed the following assessment and plan:    ICD-10-CM   1. Visit for preventive health examination  Z00.00 Hemoglobin A1c    CBC with Differential/Platelet    Comprehensive metabolic panel    Lipid panel    TSH    2. Fam hx-ischem heart disease mgm  father in 10"s   Z82.49 Hemoglobin A1c    CBC with Differential/Platelet    Comprehensive metabolic panel    Lipid panel    TSH    3. Elevated LDL cholesterol level  E78.00 Hemoglobin A1c    CBC with Differential/Platelet    Comprehensive metabolic panel    Lipid panel    TSH    4. 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 Return in about 1 year (around 04/10/2023) for depending on results.  Patient Care Team: Lorece Keach, Standley Brooking, MD as PCP - General Arvella Nigh, MD (Obstetrics and Gynecology) Patient Instructions  Good to see you today.  Continue lifestyle intervention healthy eating and exercise . Marland Kitchen  Lab today.   Consider early hearing  check  especially if  progressive symptoms .   Standley Brooking. Kacin Dancy M.D.

## 2022-04-10 ENCOUNTER — Telehealth: Payer: Self-pay | Admitting: Internal Medicine

## 2022-04-10 ENCOUNTER — Other Ambulatory Visit: Payer: Self-pay

## 2022-04-10 DIAGNOSIS — Z8249 Family history of ischemic heart disease and other diseases of the circulatory system: Secondary | ICD-10-CM

## 2022-04-10 DIAGNOSIS — E7800 Pure hypercholesterolemia, unspecified: Secondary | ICD-10-CM

## 2022-04-10 NOTE — Telephone Encounter (Signed)
Please order  ct cardiac scoring self pay  . dx elevated lipids and cv risk factor  family hxof heart disease. Thanks

## 2022-04-10 NOTE — Progress Notes (Signed)
Labs   in range  borderline blood sugar but no diabetes . Cholesterol ldl up slightly . Let me know if you want to proced with CT cardiac scoring self pay test that we discussed.  The 10-year ASCVD risk score (Arnett DK, et al., 2019) is: 4.1%   Values used to calculate the score:     Age: 67 years     Sex: Female     Is Non-Hispanic African American: No     Diabetic: No     Tobacco smoker: No     Systolic Blood Pressure: 015 mmHg     Is BP treated: No     HDL Cholesterol: 67.9 mg/dL     Total Cholesterol: 224 mg/dL

## 2022-04-10 NOTE — Telephone Encounter (Signed)
Pt would like to proceed with CT cardiac scoring test

## 2022-04-11 NOTE — Telephone Encounter (Signed)
Order placed. Pt is aware of it and inform her someone should contact her soon to schedule an appt.

## 2022-05-05 ENCOUNTER — Ambulatory Visit
Admission: RE | Admit: 2022-05-05 | Discharge: 2022-05-05 | Disposition: A | Discharge status: Home / Self Care | Payer: BC Managed Care – PPO | Source: Ambulatory Visit | Attending: Internal Medicine | Admitting: Internal Medicine

## 2022-05-05 DIAGNOSIS — Z1231 Encounter for screening mammogram for malignant neoplasm of breast: Secondary | ICD-10-CM

## 2022-05-08 ENCOUNTER — Ambulatory Visit (HOSPITAL_BASED_OUTPATIENT_CLINIC_OR_DEPARTMENT_OTHER)
Admission: RE | Admit: 2022-05-08 | Discharge: 2022-05-08 | Disposition: A | Discharge status: Home / Self Care | Payer: BC Managed Care – PPO | Source: Ambulatory Visit | Attending: Internal Medicine | Admitting: Internal Medicine

## 2022-05-08 DIAGNOSIS — Z8249 Family history of ischemic heart disease and other diseases of the circulatory system: Secondary | ICD-10-CM | POA: Insufficient documentation

## 2022-05-08 DIAGNOSIS — E7800 Pure hypercholesterolemia, unspecified: Secondary | ICD-10-CM | POA: Insufficient documentation

## 2022-05-10 NOTE — Progress Notes (Signed)
CAC score is 0  this is favorable and reassuring .  For 10 year vascular risk .  So continue lifestyle intervention healthy eating and exercise .  [email or web address]-[email or web address]/[email or web address].aspx  There were some changes in lungs that may be  from old infection and not alarming if stable . However radiology advised  to get a chest lung ct scan( no contrast)  in 6- 12 months   to be sure  is a stable finding .    Team please order a future chest ct without contrast  dx  Fu abnormal lung ct  incidental finding  for 6- 12 mos

## 2022-05-14 ENCOUNTER — Other Ambulatory Visit: Payer: Self-pay

## 2022-05-14 DIAGNOSIS — R918 Other nonspecific abnormal finding of lung field: Secondary | ICD-10-CM

## 2022-06-02 ENCOUNTER — Telehealth: Payer: Self-pay | Admitting: Internal Medicine

## 2022-06-02 NOTE — Telephone Encounter (Signed)
Emma Day with [facility] is calling and pt had ct calcium score test end of mar 2024 and per [facility] radiologist recommending pt have follow up 6-12 month later. Pt is sch for ct chest on 06-04-2022. Please advise

## 2022-06-04 ENCOUNTER — Ambulatory Visit (HOSPITAL_BASED_OUTPATIENT_CLINIC_OR_DEPARTMENT_OTHER): Admission: RE | Admit: 2022-06-04 | Payer: BC Managed Care – PPO | Source: Ambulatory Visit

## 2022-12-05 ENCOUNTER — Ambulatory Visit (HOSPITAL_BASED_OUTPATIENT_CLINIC_OR_DEPARTMENT_OTHER)
Admission: RE | Admit: 2022-12-05 | Discharge: 2022-12-05 | Disposition: A | Discharge status: Home / Self Care | Payer: Medicare Other | Source: Ambulatory Visit | Attending: Internal Medicine | Admitting: Internal Medicine

## 2022-12-05 DIAGNOSIS — R918 Other nonspecific abnormal finding of lung field: Secondary | ICD-10-CM | POA: Insufficient documentation

## 2022-12-16 ENCOUNTER — Telehealth: Payer: Self-pay | Admitting: Internal Medicine

## 2022-12-16 NOTE — Telephone Encounter (Signed)
Pt called to say she has been waiting over 10 days for the results of her CT.  Pt is asking for a call back, Asap, to discuss.

## 2022-12-16 NOTE — Telephone Encounter (Signed)
Contacted [facility] and spoke to [place]. She states she will call the reading room, to remind them to read the xray. She added sometimes it could takes 2 wks. Thank her for doing that.   Contacted pt and update pt on information above. Verbalized understanding.

## 2022-12-19 ENCOUNTER — Other Ambulatory Visit (HOSPITAL_BASED_OUTPATIENT_CLINIC_OR_DEPARTMENT_OTHER): Payer: Self-pay

## 2022-12-19 DIAGNOSIS — R918 Other nonspecific abnormal finding of lung field: Secondary | ICD-10-CM

## 2022-12-19 NOTE — Progress Notes (Signed)
So nodule better but some evidence of on going bronchial inflammation.  Assuming you have no lung symptoms fever cough this is Not alarming  but  I advise  we get a consult  from pulmonary team to address this finding and optimum plan for follow. If agree please do referral to pulmonary specialist

## 2023-02-25 IMAGING — DX DG LUMBAR SPINE COMPLETE 4+V
5 series · 5 of 5 positions shown · non-contrast
Comparison: None Available.

CLINICAL DATA: Status post fall 3 days ago.

EXAM:
LUMBAR SPINE - COMPLETE 4+ VIEW

[lumbar spine ap]
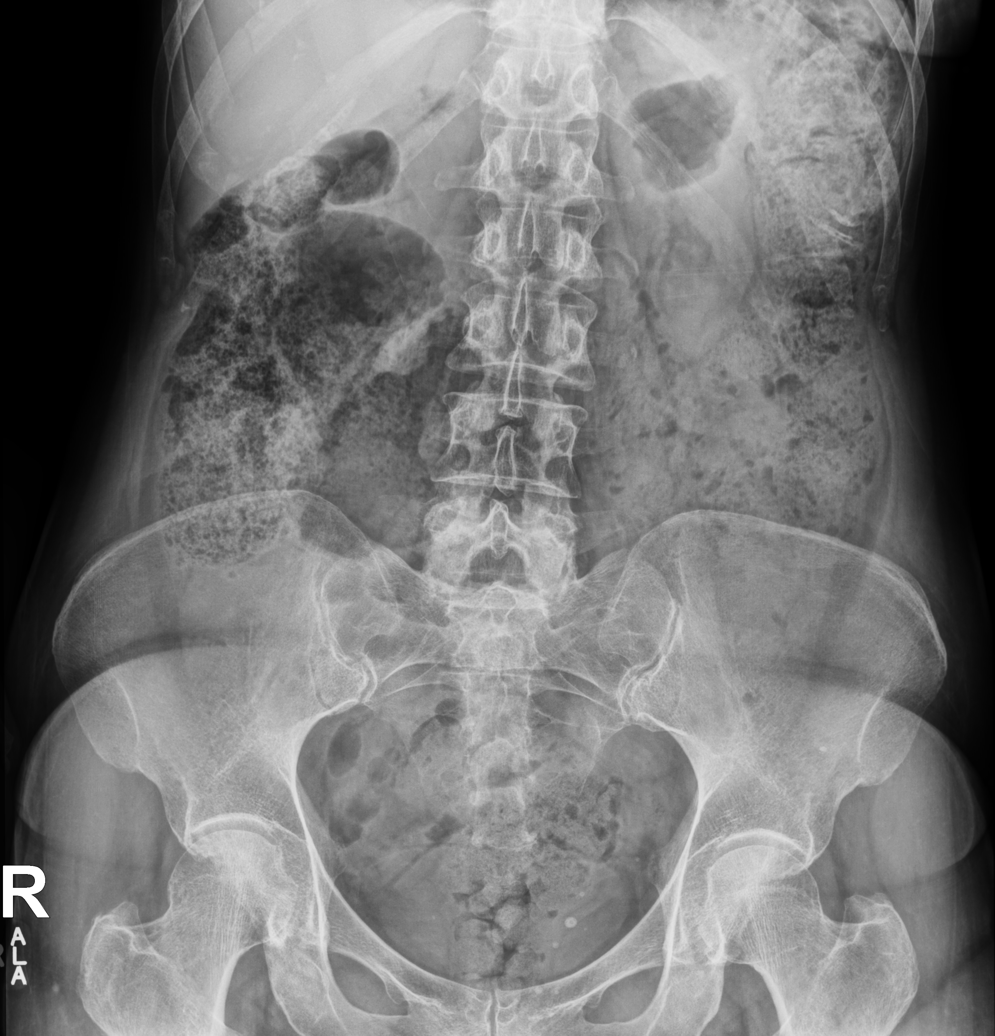

[lumbar spine oblique (1 of 2)]
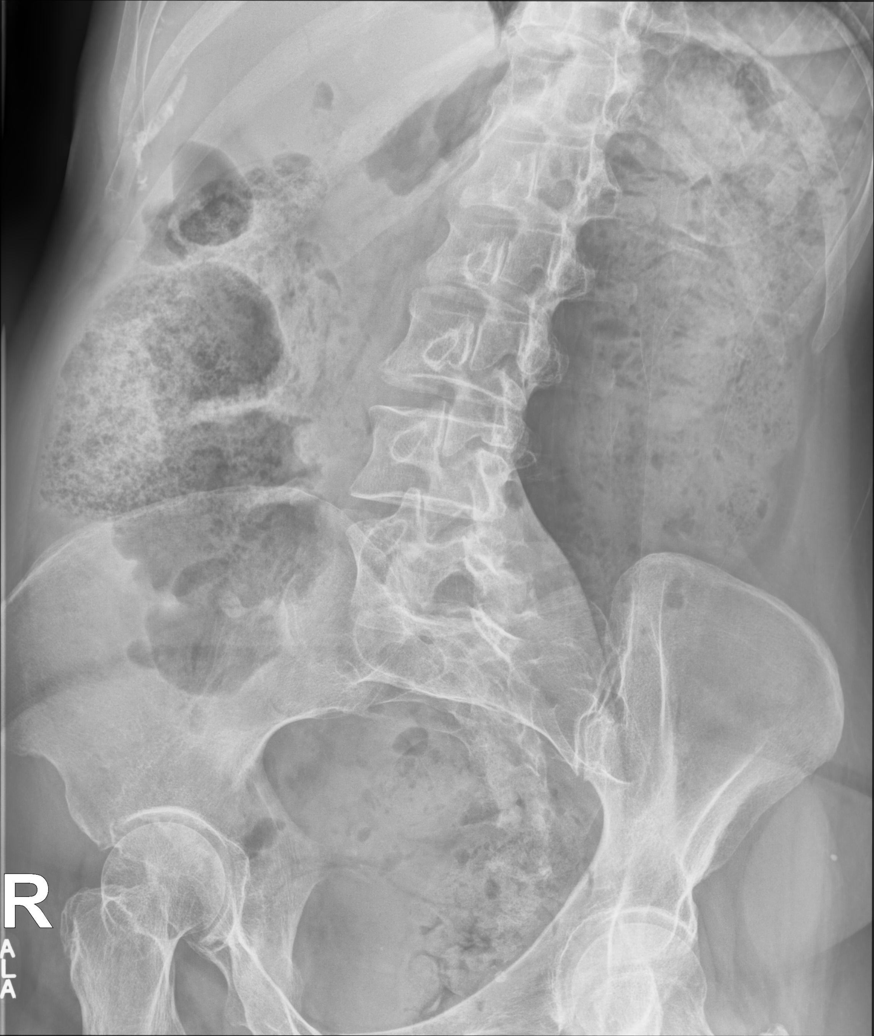

[lumbar spine oblique (2 of 2)]
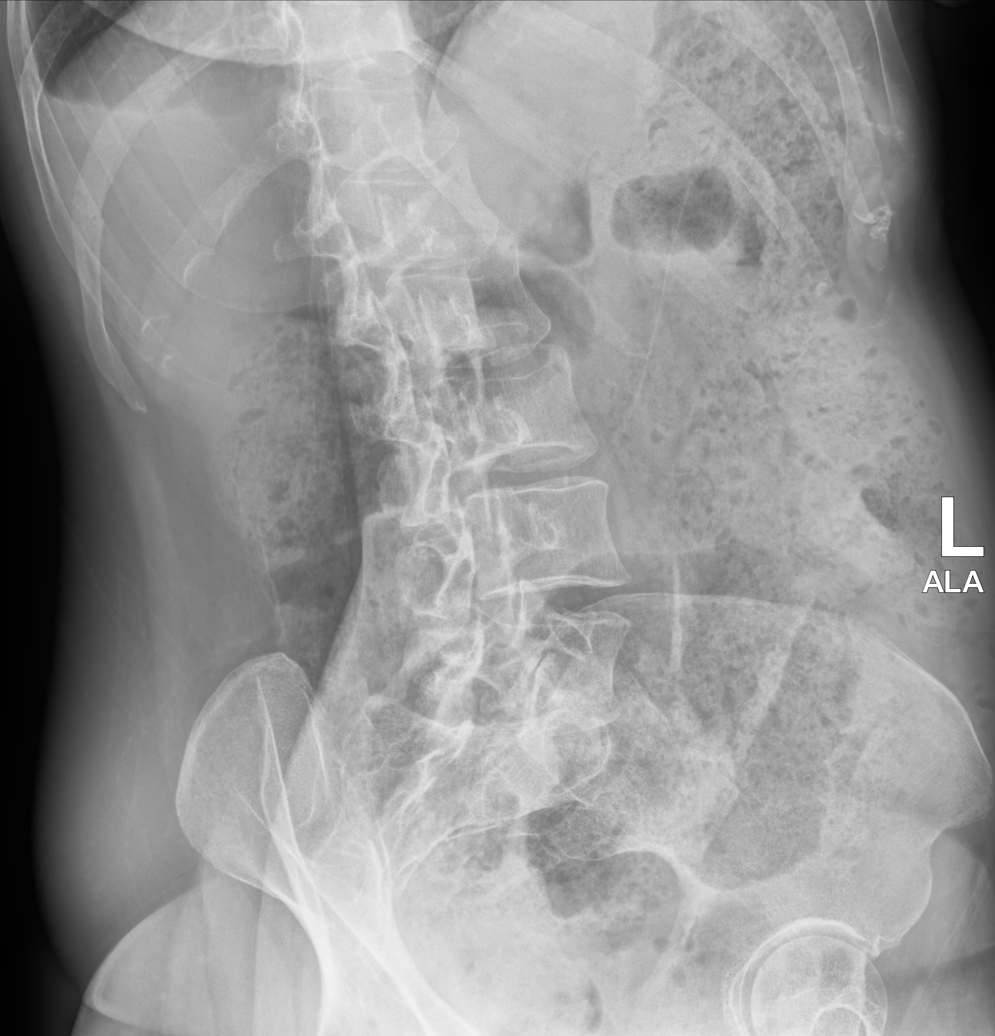

[lumbar spine lat (1 of 2)]
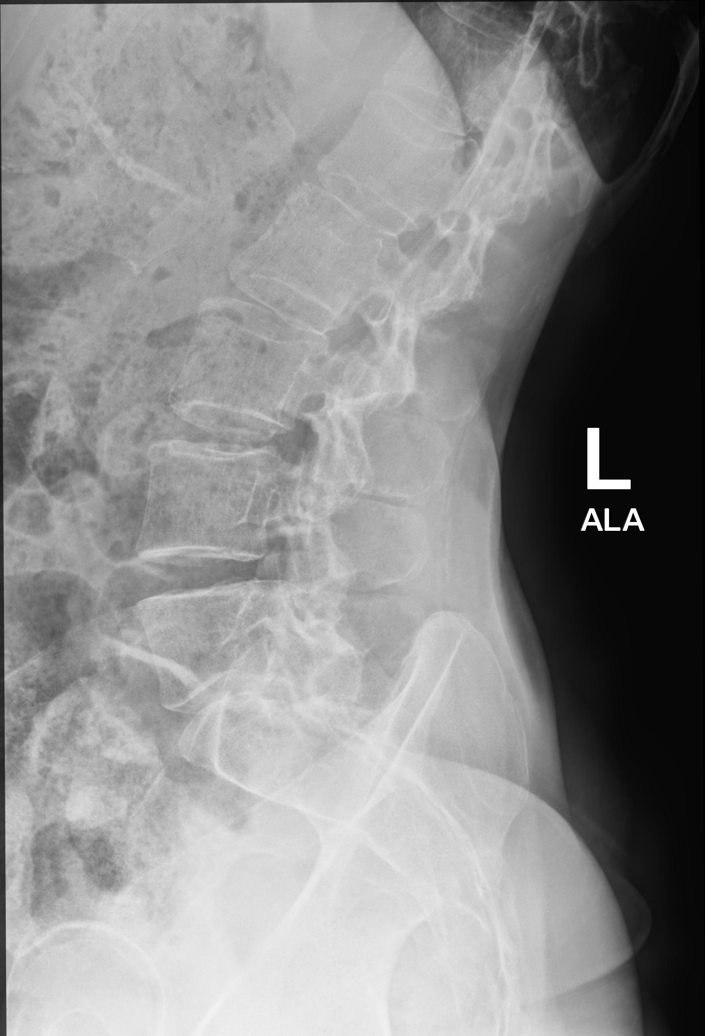

[lumbar spine lat (2 of 2)]
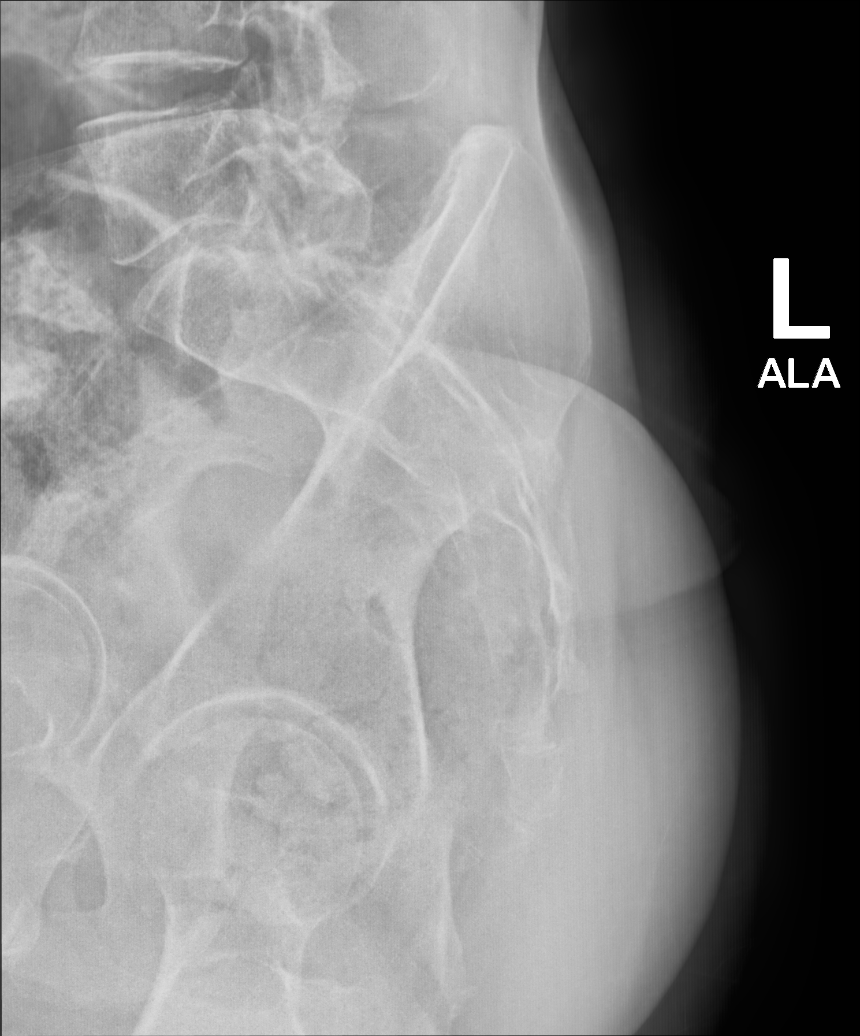

[5 of 5 positions shown; findings below may reference images not displayed]

FINDINGS: There is no evidence of lumbar spine fracture. Alignment is normal.
Intervertebral disc spaces are maintained. Of incidental note is the
presence of a very large amount of stool seen throughout the colon.
IMPRESSION: Negative.

## 2023-03-17 ENCOUNTER — Encounter: Payer: Self-pay | Admitting: Pulmonary Disease

## 2023-03-17 ENCOUNTER — Ambulatory Visit: Payer: Medicare Other | Admitting: Pulmonary Disease

## 2023-03-17 VITALS — BP 130/80 | HR 92 | Ht 65.75 in | Wt 135.2 lb

## 2023-03-17 DIAGNOSIS — J479 Bronchiectasis, uncomplicated: Secondary | ICD-10-CM | POA: Diagnosis not present

## 2023-03-17 NOTE — Patient Instructions (Addendum)
 VISIT SUMMARY:  You had a follow-up appointment to discuss the lung nodules that were found on your cardiac CT scan in March 2024. You are currently not experiencing any symptoms such as cough, mucus production, congestion, or shortness of breath. Your follow-up CT scan in October 2024 showed that the nodules are still present.  YOUR PLAN:  -Bronchiectasis, nodularity: Lung nodules are small masses of tissue in the lungs. They can be caused by infections, inflammations, or other conditions. Your nodules were first found in March 2024 and confirmed in October 2024. Since you are not experiencing any symptoms, we will monitor the nodules with another CT scan in 6 months. If you develop symptoms like cough, congestion, fevers, or fatigue, please contact us  sooner for reassessment.  INSTRUCTIONS:  Please schedule a follow-up CT scan in 6 months. If you experience any new symptoms such as cough, congestion, fevers, or fatigue, contact our office immediately for a reassessment.

## 2023-03-17 NOTE — Progress Notes (Signed)
 Emma Day    [id number]    Aug 31, 1955  Primary Care Physician:Panosh, Apolinar POUR, MD  Referring Physician: Charlett Apolinar POUR, MD [address] [place],  [place] [address]  Chief complaint: Consult for abnormal CT  HPI: 68 y.o. who  has a past medical history of History of ovarian cyst and Syncope.  Discussed the use of AI scribe software for clinical note transcription with the patient, who gave verbal consent to proceed.  The patient, a 68 year old non-smoker with no significant past medical history, presents for follow-up regarding lung nodules identified on a cardiac CT scan in March 2024. The patient reports no symptoms, including cough, mucus production, congestion, or shortness of breath. The patient has no known occupational or environmental exposures and has not traveled to any endemic areas for TB or other lung diseases. The patient's only medication is vitamins. The patient's follow-up CT scan in October 2024 showed the nodules were still present.   Pets: No pets Occupation: Used to work as a travel water quality scientist Exposures: No known exposures.  No mold, hot tub, Jacuzzi.  No feather pillows or comforters Smoking history: Never smoker Travel history: No significant travel history Relevant family history: No family history of lung disease  Outpatient Encounter Medications as of 03/17/2023  Medication Sig   Cholecalciferol (VITAMIN D-3 PO) Take by mouth.   Cyanocobalamin (VITAMIN B-12 PO) Take by mouth.   Ferrous Sulfate (IRON PO) Take 60 mg by mouth 2 (two) times a week.   FOLIC ACID  PO Take by mouth daily.   Multiple Vitamin (MULTIVITAMIN ADULT PO) Take by mouth daily.   No facility-administered encounter medications on file as of 03/17/2023.    Allergies as of 03/17/2023   (No Known Allergies)    Past Medical History:  Diagnosis Date   History of ovarian cyst    Syncope     Past Surgical History:  Procedure Laterality Date   BREAST BIOPSY Left     CATARACT EXTRACTION     COLONOSCOPY  11/12/2015   Dr.Stark   HERNIA REPAIR  04/29/2007   inguinal rt     Family History  Problem Relation Age of Onset   Colon polyps Father        pt's father was in his 32's. lg polyp removed surgically and part of colon was removed.   Coronary artery disease Other    Parkinsonism Other    Other Other        low hdl   Breast cancer Maternal Grandmother    Colon cancer Neg Hx    Esophageal cancer Neg Hx    Rectal cancer Neg Hx    Stomach cancer Neg Hx     Social History   Socioeconomic History   Marital status: Married    Spouse name: Not on file   Number of children: Not on file   Years of education: Not on file   Highest education level: Bachelor's degree (e.g., BA, AB, BS)  Occupational History   Not on file  Tobacco Use   Smoking status: Never   Smokeless tobacco: Never  Vaping Use   Vaping status: Never Used  Substance and Sexual Activity   Alcohol use: Yes    Alcohol/week: 7.0 standard drinks of alcohol    Types: 7 Standard drinks or equivalent per week    Comment: socially   Drug use: No   Sexual activity: Not on file  Other Topics Concern   Not on  file  Social History Narrative   Married   G2P2   HH of  2   After august.   Son to [employer].    Pet 2 dogs and 1 horse.                Social Drivers of Corporate Investment Banker Strain: Low Risk  (08/05/2021)   Overall Financial Resource Strain (CARDIA)    Difficulty of Paying Living Expenses: Not hard at all  Food Insecurity: No Food Insecurity (08/05/2021)   Hunger Vital Sign    Worried About Running Out of Food in the Last Year: Never true    Ran Out of Food in the Last Year: Never true  Transportation Needs: No Transportation Needs (08/05/2021)   PRAPARE - Administrator, Civil Service (Medical): No    Lack of Transportation (Non-Medical): No  Physical Activity: Sufficiently Active (08/05/2021)   Exercise Vital Sign    Days of Exercise per Week: 7 days     Minutes of Exercise per Session: 40 min  Stress: No Stress Concern Present (08/05/2021)   [employer] of Occupational Health - Occupational Stress Questionnaire    Feeling of Stress : Not at all  Social Connections: Socially Integrated (08/05/2021)   Social Connection and Isolation Panel [NHANES]    Frequency of Communication with Friends and Family: More than three times a week    Frequency of Social Gatherings with Friends and Family: More than three times a week    Attends Religious Services: 1 to 4 times per year    Active Member of [employer] or Organizations: Yes    Attends Banker Meetings: More than 4 times per year    Marital Status: Married  Catering Manager Violence: Unknown (06/07/2021)   Received from [employer], [facility]   HITS    Physically Hurt: Not on file    Insult or Talk Down To: Not on file    Threaten Physical Harm: Not on file    Scream or Curse: Not on file    Review of systems: Review of Systems  Constitutional: Negative for fever and chills.  HENT: Negative.   Eyes: Negative for blurred vision.  Respiratory: as per HPI  Cardiovascular: Negative for chest pain and palpitations.  Gastrointestinal: Negative for vomiting, diarrhea, blood per rectum. Genitourinary: Negative for dysuria, urgency, frequency and hematuria.  Musculoskeletal: Negative for myalgias, back pain and joint pain.  Skin: Negative for itching and rash.  Neurological: Negative for dizziness, tremors, focal weakness, seizures and loss of consciousness.  Endo/Heme/Allergies: Negative for environmental allergies.  Psychiatric/Behavioral: Negative for depression, suicidal ideas and hallucinations.  All other systems reviewed and are negative.  Physical Exam: Blood pressure 130/80, pulse 92, height 5' 5.75 (1.67 m), weight 135 lb 3.2 oz (61.3 kg), last menstrual period 12/02/2014, SpO2 98%. Gen:      No acute distress HEENT:  EOMI, sclera anicteric Neck:     No masses;  no thyromegaly Lungs:    Clear to auscultation bilaterally; normal respiratory effort CV:         Regular rate and rhythm; no murmurs Abd:      + bowel sounds; soft, non-tender; no palpable masses, no distension Ext:    No edema; adequate peripheral perfusion Skin:      Warm and dry; no rash Neuro: alert and oriented x 3 Psych: normal mood and affect  Data Reviewed: Imaging: Cardiac CT 05/08/2022-scattered reticulonodular opacities bilaterally CT chest 12/05/2022-areas of  bronchiectasis, clustered nodularity which is waxing and waning in nature. I have reviewed the images personally.  PFTs:  Labs:  Assessment and Plan Bronchiectasis, nodularity Asymptomatic lung nodules identified on cardiac CT in March 2024 and confirmed on follow-up CT in October 2024. Discussed the possibility of chronic mycobacterial infection and the potential need for invasive diagnostic procedures and treatment if symptoms develop or if the condition progresses.  -Plan for follow-up CT in 6 months. -If symptoms such as cough, congestion, fevers, or fatigue develop, reassess sooner.   Recommendations: CT scan and follow-up in 6 months  Lonna Coder MD [facility] 03/17/2023, 1:53 PM  CC: Panosh, Wanda K, MD

## 2023-04-22 ENCOUNTER — Telehealth: Payer: Medicare Other

## 2023-04-22 ENCOUNTER — Ambulatory Visit: Payer: Self-pay | Admitting: Internal Medicine

## 2023-04-22 ENCOUNTER — Encounter: Payer: Self-pay | Admitting: Family Medicine

## 2023-04-22 ENCOUNTER — Telehealth: Payer: Medicare Other | Admitting: Family Medicine

## 2023-04-22 VITALS — Temp 99.8°F | Ht 65.75 in | Wt 130.0 lb

## 2023-04-22 DIAGNOSIS — J470 Bronchiectasis with acute lower respiratory infection: Secondary | ICD-10-CM

## 2023-04-22 MED ORDER — DOXYCYCLINE HYCLATE 100 MG PO TABS: 100.0000 mg | ORAL_TABLET | Freq: Two times a day (BID) | ORAL | 0 refills | Status: DC

## 2023-04-22 NOTE — Telephone Encounter (Signed)
Patient has an appt with tele health 2/19

## 2023-04-22 NOTE — Progress Notes (Signed)
[facility] [address] [place] [address] Dept: [phone] Dept Fax: [phone]  Virtual Video Visit  I connected with Flonnie Overman on 04/22/23 at  9:00 AM EST by a video enabled telemedicine application and verified that I am speaking with the correct person using two identifiers.  Location patient: Home Location provider: Clinic Persons participating in the virtual visit: Patient, Provider  I discussed the limitations of evaluation and management by telemedicine and the availability of in person appointments. The patient expressed understanding and agreed to proceed.  Chief Complaint  Patient presents with   Cough    C/o having cough, fever, sinus/chest congestion/tightness x 1 week.  Has been taking Advil/Tylenol.   Neg home covid test    SUBJECTIVE:  HPI: Emma Day is a 68 y.o. female who presents with a 5-6 day history of cough, sinus congestion, and chest tightness. She notes she ran some fever on the weekend. She did a home COVID test, which was negative. The has been taking Advil and Tylenol. Her symptoms had started to improve, but on Monday, her fever returned, up to 101 F. She is also having a return of the chest tightness.   Ms. Rempel had a CT of the chest in Oct. 2024 that showed bronchiectasis and areas of nodularity, interstitial thickening, and consolidation. She recently saw Dr. Isaiah Serge (pulmonology). He plans a repeat CT scan in 6 months.  Patient Active Problem List   Diagnosis Date Noted   Fam hx-ischem heart disease mom 02/13/2012   Syncope 01/27/2012   Right lower quadrant abdominal pain 12/12/2010   Abdominal pain 03/31/2007   Past Surgical History:  Procedure Laterality Date   BREAST BIOPSY Left    CATARACT EXTRACTION     COLONOSCOPY  11/12/2015   Dr.Stark   HERNIA REPAIR  04/29/2007   inguinal rt    Family History  Problem Relation Age of Onset   Colon polyps Father        pt's  father was in his 39's. lg polyp removed surgically and part of colon was removed.   Coronary artery disease Other    Parkinsonism Other    Other Other        low hdl   Breast cancer Maternal Grandmother    Colon cancer Neg Hx    Esophageal cancer Neg Hx    Rectal cancer Neg Hx    Stomach cancer Neg Hx    Social History   Tobacco Use   Smoking status: Never   Smokeless tobacco: Never  Vaping Use   Vaping status: Never Used  Substance Use Topics   Alcohol use: Yes    Alcohol/week: 7.0 standard drinks of alcohol    Types: 7 Standard drinks or equivalent per week    Comment: socially   Drug use: No    Current Outpatient Medications:    Cholecalciferol (VITAMIN D-3 PO), Take by mouth., Disp: , Rfl:    Cyanocobalamin (VITAMIN B-12 PO), Take by mouth., Disp: , Rfl:    Ferrous Sulfate (IRON PO), Take 60 mg by mouth 2 (two) times a week., Disp: , Rfl:    FOLIC ACID PO, Take by mouth daily., Disp: , Rfl:    Multiple Vitamin (MULTIVITAMIN ADULT PO), Take by mouth daily., Disp: , Rfl:    doxycycline (VIBRA-TABS) 100 MG tablet, Take 1 tablet (100 mg total) by mouth 2 (two) times daily., Disp: 20 tablet, Rfl: 0 No Known Allergies ROS: See pertinent positives  and negatives per HPI.  OBSERVATIONS/OBJECTIVE:  VITALS per patient if applicable: Today's Vitals   04/22/23 0904  Temp: 99.8 F (37.7 C)  TempSrc: Temporal  Weight: 130 lb (59 kg)  Height: 5' 5.75" (1.67 m)   Body mass index is 21.14 kg/m.   GENERAL: Alert and oriented. Appears well and in no acute distress. HEENT: Atraumatic. Eyes clear. No obvious abnormalities on inspection of external nose and ears. NECK: Normal movements of the head and neck. LUNGS: On inspection, no signs of respiratory distress. Breathing rate appears normal. No obvious gross   SOB, gasping or wheezing, and no conversational dyspnea. CV: No obvious cyanosis. PSYCH/NEURO: Pleasant and cooperative. No obvious depression or anxiety. Speech and  thought processing grossly intact.  ASSESSMENT AND PLAN:  Problem List Items Addressed This Visit       Respiratory   Bronchiectasis with acute lower respiratory infection (HCC) - Primary   I recommend she do a home influenza test to make sure this is not what is driving her symptoms.  In light of her CT findings of bronchiectasis, Ms. Pellegrin would be at increased risk for bacterial infection. Her recurrence of fever, raises my concern for a lower respiratory tract infection. I will go ahead and prescribe a course of doxycycline to see if this will resolve her issue.       Relevant Medications   doxycycline (VIBRA-TABS) 100 MG tablet     I discussed the assessment and treatment plan with the patient. The patient was provided an opportunity to ask questions and all were answered. The patient agreed with the plan and demonstrated an understanding of the instructions.   The patient was advised to call back or seek an in-person evaluation if the symptoms worsen or if the condition fails to improve as anticipated.  Return if symptoms worsen or fail to improve.   Loyola Mast, MD

## 2023-04-22 NOTE — Assessment & Plan Note (Signed)
I recommend she do a home influenza test to make sure this is not what is driving her symptoms.  In light of her CT findings of bronchiectasis, Emma Day would be at increased risk for bacterial infection. Her recurrence of fever, raises my concern for a lower respiratory tract infection. I will go ahead and prescribe a course of doxycycline to see if this will resolve her issue.

## 2023-04-22 NOTE — Telephone Encounter (Signed)
  Chief Complaint: cough Symptoms: cough, fever, tightness in lungs Frequency: since last Sunday, intermittent Pertinent Negatives: Patient denies chest pain, shortness of breath Disposition: []ED /[]Urgent Care (no appt availability in office) / []Appointment(In office/virtual)/ [x] [facility]/ []Home Care/ []Refused Recommended Disposition /[][facility] [place]/ [] Follow-up with PCP Additional Notes: Patient reports she has been having severe cough, fever, and tightness in lungs since last Sunday. Patient reports the fever has been intermittent and is controlled with Tylenol. Temperature is currently 99.8F. Patient reports she wears a watch that tracks her o2 and it has not dropped below 93%. Per protocol, virtual visit scheduled today 2/19. Patient advised to call back with worsening symptoms. Patient verbalized understanding.    Copied from CRM #[id number]. Topic: Clinical - Red Word Triage >> Apr 22, 2023  8:14 AM Ashley D wrote: Patient/patient representative is calling to schedule an appointment. Refer to attachments for appointment information. fever, tightness in chest and coughing for a week Reason for Disposition  SEVERE coughing spells (e.g., whooping sound after coughing, vomiting after coughing)  Answer Assessment - Initial Assessment Questions 1. ONSET: "When did the cough begin?"      Last Sunday 2. SEVERITY: "How bad is the cough today?"      severe 3. SPUTUM: "Describe the color of your sputum" (none, dry cough; clear, white, yellow, green)     Dry cough 4. HEMOPTYSIS: "Are you coughing up any blood?" If so ask: "How much?" (flecks, streaks, tablespoons, etc.)     none 5. DIFFICULTY BREATHING: "Are you having difficulty breathing?" If Yes, ask: "How bad is it?" (e.g., mild, moderate, severe)    - MILD: No SOB at rest, mild SOB with walking, speaks normally in sentences, can lie down, no retractions, pulse < 100.    - MODERATE: SOB at rest, SOB with minimal  exertion and prefers to sit, cannot lie down flat, speaks in phrases, mild retractions, audible wheezing, pulse 100-120.    - SEVERE: Very SOB at rest, speaks in single words, struggling to breathe, sitting hunched forward, retractions, pulse > 120      none 6. FEVER: "Do you have a fever?" If Yes, ask: "What is your temperature, how was it measured, and when did it start?"     99 .8 today, 101F yesterday 7. CARDIAC HISTORY: "Do you have any history of heart disease?" (e.g., heart attack, congestive heart failure)      none 8. LUNG HISTORY: "Do you have any history of lung disease?"  (e.g., pulmonary embolus, asthma, emphysema)     none 9. PE RISK FACTORS: "Do you have a history of blood clots?" (or: recent major surgery, recent prolonged travel, bedridden)     none 10. OTHER SYMPTOMS: "Do you have any other symptoms?" (e.g., runny nose, wheezing, chest pain)      Fever, congestion, tightness in lungs  Protocols used: Cough - Acute Non-Productive-A-AH

## 2023-07-08 ENCOUNTER — Other Ambulatory Visit: Payer: Self-pay | Admitting: Internal Medicine

## 2023-07-08 DIAGNOSIS — Z1231 Encounter for screening mammogram for malignant neoplasm of breast: Secondary | ICD-10-CM

## 2023-07-22 ENCOUNTER — Ambulatory Visit
Admission: RE | Admit: 2023-07-22 | Discharge: 2023-07-22 | Disposition: A | Discharge status: Home / Self Care | Source: Ambulatory Visit | Attending: Internal Medicine | Admitting: Internal Medicine

## 2023-07-22 DIAGNOSIS — Z1231 Encounter for screening mammogram for malignant neoplasm of breast: Secondary | ICD-10-CM

## 2023-09-15 ENCOUNTER — Other Ambulatory Visit

## 2023-09-16 ENCOUNTER — Other Ambulatory Visit (INDEPENDENT_AMBULATORY_CARE_PROVIDER_SITE_OTHER)

## 2023-09-16 ENCOUNTER — Ambulatory Visit: Payer: Self-pay | Admitting: Internal Medicine

## 2023-09-16 ENCOUNTER — Encounter: Payer: Self-pay | Admitting: Internal Medicine

## 2023-09-16 ENCOUNTER — Ambulatory Visit (INDEPENDENT_AMBULATORY_CARE_PROVIDER_SITE_OTHER): Admitting: Internal Medicine

## 2023-09-16 VITALS — BP 130/80 | HR 74 | Temp 98.3°F | Ht 66.0 in | Wt 134.0 lb

## 2023-09-16 DIAGNOSIS — E7800 Pure hypercholesterolemia, unspecified: Secondary | ICD-10-CM

## 2023-09-16 DIAGNOSIS — Z7689 Persons encountering health services in other specified circumstances: Secondary | ICD-10-CM

## 2023-09-16 DIAGNOSIS — R040 Epistaxis: Secondary | ICD-10-CM | POA: Diagnosis not present

## 2023-09-16 DIAGNOSIS — Z0000 Encounter for general adult medical examination without abnormal findings: Secondary | ICD-10-CM

## 2023-09-16 DIAGNOSIS — H9319 Tinnitus, unspecified ear: Secondary | ICD-10-CM

## 2023-09-16 DIAGNOSIS — E2839 Other primary ovarian failure: Secondary | ICD-10-CM | POA: Diagnosis not present

## 2023-09-16 DIAGNOSIS — R918 Other nonspecific abnormal finding of lung field: Secondary | ICD-10-CM

## 2023-09-16 DIAGNOSIS — R739 Hyperglycemia, unspecified: Secondary | ICD-10-CM | POA: Diagnosis not present

## 2023-09-16 LAB — BASIC METABOLIC PANEL WITH GFR
BUN: 12 mg/dL (ref 6–23)
CO2: 27 meq/L (ref 19–32)
Calcium: 10.2 mg/dL (ref 8.4–10.5)
Chloride: 104 meq/L (ref 96–112)
Creatinine, Ser: 0.74 mg/dL (ref 0.40–1.20)
GFR: 84.57 mL/min (ref >60.00)
Glucose, Bld: 107 mg/dL — ABNORMAL HIGH (ref 70–99)
Potassium: 4.9 meq/L (ref 3.5–5.1)
Sodium: 139 meq/L (ref 135–145)

## 2023-09-16 LAB — CBC WITH DIFFERENTIAL/PLATELET
Basophils Absolute: 0 K/uL (ref 0.0–0.1)
Basophils Relative: 0.4 % (ref 0.0–3.0)
Eosinophils Absolute: 0.1 K/uL (ref 0.0–0.7)
Eosinophils Relative: 1.8 % (ref 0.0–5.0)
HCT: 44.2 % (ref 36.0–46.0)
Hemoglobin: 14.4 g/dL (ref 12.0–15.0)
Lymphocytes Relative: 20.5 % (ref 12.0–46.0)
Lymphs Abs: 1.2 K/uL (ref 0.7–4.0)
MCHC: 32.6 g/dL (ref 30.0–36.0)
MCV: 89.1 fl (ref 78.0–100.0)
Monocytes Absolute: 0.4 K/uL (ref 0.1–1.0)
Monocytes Relative: 6.8 % (ref 3.0–12.0)
Neutro Abs: 4.3 K/uL (ref 1.4–7.7)
Neutrophils Relative %: 70.5 % (ref 43.0–77.0)
Platelets: 236 K/uL (ref 150.0–400.0)
RBC: 4.96 Mil/uL (ref 3.87–5.11)
RDW: 14 % (ref 11.5–15.5)
WBC: 6.1 K/uL (ref 4.0–10.5)

## 2023-09-16 LAB — LIPID PANEL
Cholesterol: 215 mg/dL — ABNORMAL HIGH (ref 0–200)
HDL: 70.9 mg/dL (ref >39.00)
LDL Cholesterol: 124 mg/dL — ABNORMAL HIGH (ref 0–99)
NonHDL: 143.9
Total CHOL/HDL Ratio: 3
Triglycerides: 100 mg/dL (ref 0.0–149.0)
VLDL: 20 mg/dL (ref 0.0–40.0)

## 2023-09-16 LAB — HEMOGLOBIN A1C: Hgb A1c MFr Bld: 5.9 % (ref 4.6–6.5)

## 2023-09-16 NOTE — Patient Instructions (Addendum)
  Good to see you today .  Exam is good today . Continue lifestyle intervention healthy eating and exercise .  Moisturizing   font of nose .   And if recurrent bleeding then  we can have ENT  check this out.  You should be contacted about DEXA SCAN  appt.

## 2023-09-16 NOTE — Progress Notes (Signed)
Disc at OV

## 2023-09-16 NOTE — Progress Notes (Signed)
 Subjective:    Emma Day is a 68 y.o. female who presents for a Welcome to Medicare exam.  Generally well had labs this am   3 nose bleeds in last 3 months    3 miles per day  had allegra and then stopped  seems to correlate from lots of physical exercise  that day .  Resistance exercises also .     Chests ct  and fu  and went to pulmonary and used 6 mos  fu . Due for August CT.    Cardiac Risk Factors include: advanced age (>26men, >94 women)      Objective:    Today's Vitals   09/16/23 1523  BP: 130/80  Pulse: 74  Temp: 98.3 F (36.8 C)  TempSrc: Oral  SpO2: 98%  Weight: 134 lb (60.8 kg)  Height: 5' 6 (1.676 m)  Body mass index is 21.63 kg/m.  Medications Outpatient Encounter Medications as of 09/16/2023  Medication Sig   Ferrous Sulfate (IRON PO) Take 60 mg by mouth 2 (two) times a week.   FOLIC ACID  PO Take by mouth daily.   Multiple Vitamin (MULTIVITAMIN ADULT PO) Take by mouth daily.   Cholecalciferol (VITAMIN D-3 PO) Take by mouth.   Cyanocobalamin (VITAMIN B-12 PO) Take by mouth.   doxycycline  (VIBRA -TABS) 100 MG tablet Take 1 tablet (100 mg total) by mouth 2 (two) times daily.   No facility-administered encounter medications on file as of 09/16/2023.     History: Past Medical History:  Diagnosis Date   History of ovarian cyst    Syncope    Past Surgical History:  Procedure Laterality Date   BREAST BIOPSY Left    CATARACT EXTRACTION     COLONOSCOPY  11/12/2015   Dr.Stark   HERNIA REPAIR  04/29/2007   inguinal rt     Family History  Problem Relation Age of Onset   Colon polyps Father        pt's father was in his 55's. lg polyp removed surgically and part of colon was removed.   Coronary artery disease Other    Parkinsonism Other    Other Other        low hdl   Breast cancer Maternal Grandmother    Colon cancer Neg Hx    Esophageal cancer Neg Hx    Rectal cancer Neg Hx    Stomach cancer Neg Hx    Social History   Occupational  History   Not on file  Tobacco Use   Smoking status: Never   Smokeless tobacco: Never  Vaping Use   Vaping status: Never Used  Substance and Sexual Activity   Alcohol use: Yes    Alcohol/week: 7.0 standard drinks of alcohol    Types: 7 Standard drinks or equivalent per week    Comment: socially   Drug use: No   Sexual activity: Not on file    Tobacco Counseling Counseling given: Not Answered   Immunizations and Health Maintenance Immunization History  Administered Date(s) Administered   Influenza Split 12/12/2010, 01/27/2012   Influenza,inj,Quad PF,6+ Mos 02/12/2015, 04/09/2022   Influenza-Unspecified 12/26/2020   PFIZER(Purple Top)SARS-COV-2 Vaccination 05/25/2019, 06/15/2019, 01/16/2020, 06/14/2020   PNEUMOCOCCAL CONJUGATE-20 06/05/2023   Pfizer Covid-19 Vaccine Bivalent Booster 26yrs & up 12/26/2020   Pneumococcal-Unspecified 06/05/2023   Tdap 09/05/2015   Zoster Recombinant(Shingrix) 11/06/2020, 01/07/2021   Health Maintenance Due  Topic Date Due   HIV Screening  Never done   DEXA SCAN  Never done    Activities  of Daily Living    09/16/2023    3:20 PM 09/14/2023   11:35 AM  In your present state of health, do you have any difficulty performing the following activities:  Hearing? 0 0  Vision? 0 0  Difficulty concentrating or making decisions? 0 0  Walking or climbing stairs? 0 0  Dressing or bathing? 0 0  Doing errands, shopping? 0 0  Preparing Food and eating ? N N  Using the Toilet? N N  In the past six months, have you accidently leaked urine? N N  Do you have problems with loss of bowel control? N N  Managing your Medications? N N  Managing your Finances? N N  Housekeeping or managing your Housekeeping? N N    Physical Exam   Physical Exam (optional), or other factors deemed appropriate based on the beneficiary's medical and social history and current clinical standards. Physical Exam: Vital signs reviewed HZW:Uypd is a well-developed  well-nourished alert cooperative  female who appears her stated age in no acute distress.  HEENT: normocephalic atraumatic , Eyes: PERRL EOM's full, conjunctiva clear, Nares: paten,t no deformity discharge or tenderness., Ears: no deformity EAC's clear TMs with normal landmarks. Mouth: clear OP, no lesions, edema.  Moist mucous membranes. Dentition in adequate repair. NECK: supple without masses, thyromegaly or bruits. CHEST/PULM:  Clear to auscultation and percussion breath sounds equal no wheeze , rales or rhonchi. No chest wall deformities or tenderness. CV: PMI is nondisplaced, S1 S2 no gallops, murmurs, rubs. Peripheral pulses are full without delay.No JVD .  ABDOMEN: Bowel sounds normal nontender  No guard or rebound, no hepato splenomegal no CVA tenderness.   Extremtities:  No clubbing cyanosis or edema, no acute joint swelling or redness no focal atrophy NEURO:  Oriented x3, cranial nerves 3-12 appear to be intact, no obvious focal weakness,gait within normal limits no abnormal reflexes or asymmetrical SKIN: No acute rashes normal turgor, color, no bruising or petechiae. PSYCH: Oriented, good eye contact, no obvious depression anxiety, cognition and judgment appear normal. 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 Yes  Vision/Hearing screen Hearing Screening   500Hz  1000Hz  2000Hz  3000Hz  5000Hz   Right ear Pass Pass Pass Pass Pass  Left ear Pass Pass Pass Pass Pass   Vision Screening   Right eye Left eye Both eyes  Without correction 20/20 20/20 20/20   With correction        Goals   None     Depression Screen     09/16/2023    3:21 PM 04/09/2022   11:39 AM 08/06/2021    8:30 AM 11/06/2020   10:31 AM  PHQ 2/9 Scores  PHQ - 2 Score 0 0 0 0  PHQ- 9 Score  0  1     Fall Risk    09/16/2023    3:21 PM  Fall Risk   Falls in the past year? 0  Number falls in past yr: 0  Injury with Fall? 0  Risk for fall due to : No Fall Risks  Follow up Falls evaluation completed    Cognitive  Function:    09/16/2023    3:21 PM  MMSE - Mini Mental State Exam  Orientation to time 5  Orientation to Place 5  Registration 3  Attention/ Calculation 5  Recall 3  Language- name 2 objects 2  Language- repeat 1  Language- follow 3 step command 3  Language- read & follow direction 1  Write a sentence 1  Copy design 1  Total score 30        09/16/2023    3:22 PM  6CIT Screen  What Year? 0 points  What month? 0 points  What time? 0 points  Count back from 20 0 points  Months in reverse 0 points  Repeat phrase 0 points  Total Score 0 points    Patient Care Team: Wendelin Reader, Apolinar POUR, MD as PCP - General Leva Rush, MD (Obstetrics and Gynecology)     Plan:     I have personally reviewed and noted the following in the patient's chart:   Medical and social history Use of alcohol, tobacco or illicit drugs  Current medications and supplements including opioid prescriptions. Patient is not currently taking opioid prescriptions. Functional ability and status Nutritional status Physical activity Advanced directives List of other physicians Hospitalizations, surgeries, and ER visits in previous 12 months Vitals Screenings to include cognitive, depression, and falls Referrals and appointments  In addition, I have reviewed and discussed with patient certain preventive protocols, quality metrics, and best practice recommendations. A written personalized care plan for preventive services as well as general preventive health recommendations were provided to patient.

## 2023-10-05 ENCOUNTER — Ambulatory Visit
Admission: RE | Admit: 2023-10-05 | Discharge: 2023-10-05 | Disposition: A | Discharge status: Home / Self Care | Payer: Medicare Other | Source: Ambulatory Visit | Attending: Pulmonary Disease

## 2023-10-05 DIAGNOSIS — J479 Bronchiectasis, uncomplicated: Secondary | ICD-10-CM

## 2023-10-17 ENCOUNTER — Ambulatory Visit: Payer: Self-pay | Admitting: Pulmonary Disease

## 2023-12-04 ENCOUNTER — Ambulatory Visit (INDEPENDENT_AMBULATORY_CARE_PROVIDER_SITE_OTHER): Admitting: Pulmonary Disease

## 2023-12-04 ENCOUNTER — Encounter: Payer: Self-pay | Admitting: Pulmonary Disease

## 2023-12-04 VITALS — BP 134/84 | HR 82 | Temp 98.6°F | Ht 66.0 in | Wt 137.0 lb

## 2023-12-04 DIAGNOSIS — J470 Bronchiectasis with acute lower respiratory infection: Secondary | ICD-10-CM | POA: Diagnosis not present

## 2023-12-04 NOTE — Patient Instructions (Signed)
   VISIT SUMMARY: You had a follow-up visit to monitor your lung nodularity and potential mycobacterial infection. Your recent CT scan showed a slight worsening of the nodularity pattern, but you have no symptoms like cough or fatigue and are maintaining your daily activity level.  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 Your recent CT scan showed a slight worsening of the nodularity pattern, but you have no significant symptoms. -Schedule a follow-up CT scan in six months. -Perform lung function tests at the time of the next CT scan to assess lung performance. -Continue regular physical activity, including walking, to maintain lung health. -Monitor for symptoms such as increased cough, sputum production, fatigue, or fever, which may necessitate further intervention. -Consider bronchoscopy for culture if symptoms worsen or if there is significant progression on imaging.

## 2023-12-04 NOTE — Progress Notes (Signed)
 Emma Day    [id number]    October 29, 1955  Primary Care Physician:Panosh, Apolinar POUR, MD  Referring Physician: Charlett Apolinar POUR, MD [address] [place],  [place] [address]  Chief complaint: Follow-up for abnormal CT, concern for MAI  HPI: 68 y.o. who  has a past medical history of History of ovarian cyst and Syncope.  Discussed the use of AI scribe software for clinical note transcription with the patient, who gave verbal consent to proceed.  The patient, a 68 year old non-smoker with no significant past medical history, presents for follow-up regarding lung nodules identified on a cardiac CT scan in March 2024. The patient reports no symptoms, including cough, mucus production, congestion, or shortness of breath. The patient has no known occupational or environmental exposures and has not traveled to any endemic areas for TB or other lung diseases. The patient's only medication is vitamins. The patient's follow-up CT scan in October 2024 showed the nodules were still present.   Interim history: Discussed the use of AI scribe software for clinical note transcription with the patient, who gave verbal consent to proceed.  History of Present Illness Emma Day is a 68 year old female with bronchiectasis who presents for follow-up of lung nodularity and potential mycobacterial infection. She is accompanied by her husband, Emma Day.  Pulmonary nodularity and bronchiectasis - Chronic bronchiectasis with lung nodularity under surveillance - Earlier CT scan this year showed mild inflammatory changes with a pattern suggestive of chronic mycobacterial infection - Most recent CT scan demonstrates slight worsening of nodularity pattern - No cough, mucus production, or fatigue - Maintains daily activity level, able to walk three to four miles without difficulty   Relevant pulmonary history Pets: No pets Occupation: Used to work as a travel Water quality scientist Exposures: No known exposures.   No mold, hot tub, Jacuzzi.  No feather pillows or comforters Smoking history: Never smoker Travel history: No significant travel history Relevant family history: No family history of lung disease  Outpatient Encounter Medications as of 12/04/2023  Medication Sig   Ferrous Sulfate (IRON PO) Take 60 mg by mouth 2 (two) times a week.   FOLIC ACID  PO Take by mouth daily.   Multiple Vitamin (MULTIVITAMIN ADULT PO) Take by mouth daily.   No facility-administered encounter medications on file as of 12/04/2023.   Vitals:   12/04/23 0841  BP: 134/84  Pulse: 82  Temp: 98.6 F (37 C)  SpO2: 99%    Physical Exam GEN: No acute distress. CV: Regular rate and rhythm, no murmurs. LUNGS: Clear to auscultation bilaterally, normal respiratory effort. SKIN JOINTS: Warm and dry, no rash.   Data Reviewed: Imaging: Cardiac CT 05/08/2022-scattered reticulonodular opacities bilaterally CT chest 12/05/2022-areas of bronchiectasis, clustered nodularity which is waxing and waning in nature. CT chest 8//2025-slight increase in centrilobular nodularity, consolidation. I have reviewed the images personally.  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 Slight worsening of nodularity on recent CT scan, but no significant change in symptoms. No cough, mucus production, or other respiratory symptoms. The condition is common in older white women and often managed conservatively unless symptoms worsen. Treatment involves multiple antibiotics for an extended period, which can have significant side effects. Current approach is to monitor the condition due to the absence of symptoms and minimal progression on imaging. Bronchoscopy discussed as a diagnostic option  if symptoms worsen or imaging shows significant progression. - Schedule a follow-up CT scan in six  months. - Perform lung function tests at the time of the next CT scan to assess lung performance. - Continue regular physical activity, including walking, to maintain lung health. - Monitor for symptoms such as increased cough, sputum production, fatigue, or fever, which may necessitate further intervention. - Consider bronchoscopy for culture if symptoms worsen or if there is significant progression on imaging.    Recommendations: CT scan, PFTs and follow-up in 6 months  Lonna Coder MD [facility] 12/04/2023, 8:57 AM  CC: Panosh, Wanda K, MD

## 2024-05-02 ENCOUNTER — Other Ambulatory Visit (HOSPITAL_BASED_OUTPATIENT_CLINIC_OR_DEPARTMENT_OTHER)

## 2024-06-03 ENCOUNTER — Encounter

## 2024-06-06 ENCOUNTER — Other Ambulatory Visit
